# Patient Record
Sex: Female | Born: 1955 | Race: White | Hispanic: No | Smoking: Never smoker
Health system: Southern US, Community
[De-identification: ages and names within clinical notes are randomized; demographics above are authoritative.]

## PROBLEM LIST (undated history)

## (undated) DIAGNOSIS — G500 Trigeminal neuralgia: Secondary | ICD-10-CM

## (undated) DIAGNOSIS — T7840XA Allergy, unspecified, initial encounter: Secondary | ICD-10-CM

## (undated) DIAGNOSIS — H269 Unspecified cataract: Secondary | ICD-10-CM

## (undated) DIAGNOSIS — M810 Age-related osteoporosis without current pathological fracture: Secondary | ICD-10-CM

## (undated) DIAGNOSIS — J45909 Unspecified asthma, uncomplicated: Secondary | ICD-10-CM

## (undated) DIAGNOSIS — I639 Cerebral infarction, unspecified: Secondary | ICD-10-CM

## (undated) DIAGNOSIS — I10 Essential (primary) hypertension: Secondary | ICD-10-CM

## (undated) DIAGNOSIS — G35 Multiple sclerosis: Secondary | ICD-10-CM

## (undated) DIAGNOSIS — F419 Anxiety disorder, unspecified: Secondary | ICD-10-CM

## (undated) HISTORY — DX: Allergy, unspecified, initial encounter: T78.40XA

## (undated) HISTORY — DX: Unspecified cataract: H26.9

## (undated) HISTORY — DX: Cerebral infarction, unspecified: I63.9

## (undated) HISTORY — DX: Anxiety disorder, unspecified: F41.9

## (undated) HISTORY — PX: TUBAL LIGATION: SHX77

## (undated) HISTORY — DX: Age-related osteoporosis without current pathological fracture: M81.0

## (undated) HISTORY — PX: EYE SURGERY: SHX253

---

## 1999-05-11 ENCOUNTER — Other Ambulatory Visit: Admission: RE | Admit: 1999-05-11 | Discharge: 1999-05-11 | Payer: Self-pay | Admitting: Gynecology

## 2000-06-02 ENCOUNTER — Other Ambulatory Visit: Admission: RE | Admit: 2000-06-02 | Discharge: 2000-06-02 | Payer: Self-pay | Admitting: Gynecology

## 2001-08-07 ENCOUNTER — Other Ambulatory Visit: Admission: RE | Admit: 2001-08-07 | Discharge: 2001-08-07 | Payer: Self-pay | Admitting: Gynecology

## 2002-08-09 ENCOUNTER — Other Ambulatory Visit: Admission: RE | Admit: 2002-08-09 | Discharge: 2002-08-09 | Payer: Self-pay | Admitting: Gynecology

## 2003-08-19 ENCOUNTER — Other Ambulatory Visit: Admission: RE | Admit: 2003-08-19 | Discharge: 2003-08-19 | Payer: Self-pay | Admitting: Gynecology

## 2005-02-11 ENCOUNTER — Other Ambulatory Visit: Admission: RE | Admit: 2005-02-11 | Discharge: 2005-02-11 | Payer: Self-pay | Admitting: Gynecology

## 2013-02-16 DIAGNOSIS — G894 Chronic pain syndrome: Secondary | ICD-10-CM | POA: Insufficient documentation

## 2013-08-30 HISTORY — PX: INSERTION / PLACEMENT / REVISION NEUROSTIMULATOR: SUR720

## 2016-01-05 DIAGNOSIS — I10 Essential (primary) hypertension: Secondary | ICD-10-CM | POA: Insufficient documentation

## 2018-02-13 ENCOUNTER — Encounter (HOSPITAL_COMMUNITY): Payer: Self-pay

## 2018-02-13 ENCOUNTER — Encounter (HOSPITAL_COMMUNITY)
Admission: RE | Admit: 2018-02-13 | Discharge: 2018-02-13 | Disposition: A | Discharge status: Home / Self Care | Payer: BLUE CROSS/BLUE SHIELD | Source: Ambulatory Visit | Attending: Psychiatry | Admitting: Psychiatry

## 2018-02-13 DIAGNOSIS — G35 Multiple sclerosis: Secondary | ICD-10-CM | POA: Diagnosis present

## 2018-02-13 HISTORY — DX: Multiple sclerosis: G35

## 2018-02-13 HISTORY — DX: Unspecified asthma, uncomplicated: J45.909

## 2018-02-13 HISTORY — DX: Essential (primary) hypertension: I10

## 2018-02-13 HISTORY — DX: Trigeminal neuralgia: G50.0

## 2018-02-13 MED ORDER — ACETAMINOPHEN 500 MG PO TABS
1000.0000 mg | ORAL_TABLET | ORAL | Status: DC
  Administered 2018-02-13: 1000 mg via ORAL
  Filled 2018-02-13: qty 2

## 2018-02-13 MED ORDER — DIPHENHYDRAMINE HCL 50 MG/ML IJ SOLN
25.0000 mg | INTRAMUSCULAR | Status: DC
  Administered 2018-02-13: 25 mg via INTRAVENOUS
  Filled 2018-02-13: qty 1

## 2018-02-13 MED ORDER — SODIUM CHLORIDE 0.9 % IV SOLN
INTRAVENOUS | Status: DC
  Administered 2018-02-13: 08:00:00 via INTRAVENOUS

## 2018-02-13 MED ORDER — SODIUM CHLORIDE 0.9 % IV SOLN
300.0000 mg | INTRAVENOUS | Status: DC
  Administered 2018-02-13: 300 mg via INTRAVENOUS
  Filled 2018-02-13: qty 10

## 2018-02-13 MED ORDER — METHYLPREDNISOLONE SODIUM SUCC 125 MG IJ SOLR
125.0000 mg | INTRAMUSCULAR | Status: DC
  Administered 2018-02-13: 125 mg via INTRAVENOUS
  Filled 2018-02-13: qty 2

## 2018-02-13 NOTE — Progress Notes (Signed)
Patient here for first Ocrevus infusion. Tolerated well. All d/c instructions reviewed with patient. Verbalizes understanding of return appt in two weeks. Also aware of when to call MD. (Prior to infusion today results of negative Hep B screening faxed to [place] from MD office and results of negative quantiferon gold screening told over phone (awaiting fax copy).

## 2018-02-13 NOTE — Progress Notes (Signed)
Incomplete          Patient here for first Ocrevus infusion. Have copy of Hepatitis B result (non reactive) from office. Called and spoke to [place] at MD office and quantiferon gold (TB) result was negative  (awaiting fax result to come through).

## 2018-02-13 NOTE — Discharge Instructions (Signed)
Call 911 for emergencies Call Md for problems or questions. Dr. Leotis Shames [phone]   Reg[facility] Ocrelizumab injection What is this medicine? OCRELIZUMAB (ok re LIZ ue mab) treats multiple sclerosis. It helps to decrease the number of multiple sclerosis relapses. It is not a cure. This medicine may be used for other purposes; ask your health care provider or pharmacist if you have questions. COMMON BRAND NAME(S): OCREVUS What should I tell my health care provider before I take this medicine? They need to know if you have any of these conditions: -cancer -hepatitis B infection -other infection (especially a virus infection such as chickenpox, cold sores, or herpes) -an unusual or allergic reaction to ocrelizumab, other medicines, foods, dyes or preservatives -pregnant or trying to get pregnant -breast-feeding How should I use this medicine? This medicine is for infusion into a vein. It is given by a health care professional in a hospital or clinic setting. Talk to your pediatrician regarding the use of this medicine in children. Special care may be needed. Overdosage: If you think you have taken too much of this medicine contact a poison control center or emergency room at once. NOTE: This medicine is only for you. Do not share this medicine with others. What if I miss a dose? Keep appointments for follow-up doses as directed. It is important not to miss your dose. Call your doctor or health care professional if you are unable to keep an appointment. What may interact with this medicine? -alemtuzumab -daclizumab -dimethyl fumarate -fingolimod -glatiramer -interferon beta -live virus vaccines -mitoxantrone -natalizumab -peginterferon beta -rituximab -steroid medicines like prednisone or cortisone -teriflunomide This list may not describe all possible interactions. Give your health care provider a list of all the medicines, herbs, non-prescription drugs, or dietary supplements you  use. Also tell them if you smoke, drink alcohol, or use illegal drugs. Some items may interact with your medicine. What should I watch for while using this medicine? Tell your doctor or healthcare professional if your symptoms do not start to get better or if they get worse. This medicine can cause serious allergic reactions. To reduce your risk you may need to take medicine before treatment with this medicine. Take your medicine as directed. Women should inform their doctor if they wish to become pregnant or think they might be pregnant. There is a potential for serious side effects to an unborn child. Talk to your health care professional or pharmacist for more information. Female patients should use effective birth control methods while receiving this medicine and for 6 months after the last dose. Call your doctor or health care professional for advice if you get a fever, chills or sore throat, or other symptoms of a cold or flu. Do not treat yourself. This drug decreases your body's ability to fight infections. Try to avoid being around people who are sick. If you have a hepatitis B infection or a history of a hepatitis B infection, talk to your doctor. The symptoms of hepatitis B may get worse if you take this medicine. In some patients, this medicine may cause a serious brain infection that may cause death. If you have any problems seeing, thinking, speaking, walking, or standing, tell your doctor right away. If you cannot reach your doctor, urgently seek other source of medical care. This medicine can decrease the response to a vaccine. If you need to get vaccinated, tell your healthcare professional if you have received this medicine. Extra booster doses may be needed. Talk to your doctor to see  if a different vaccination schedule is needed. Talk to your doctor about your risk of cancer. You may be more at risk for certain types of cancers if you take this medicine. What side effects may I notice  from receiving this medicine? Side effects that you should report to your doctor or health care professional as soon as possible: -allergic reactions like skin rash, itching or hives, swelling of the face, lips, or tongue -breathing problems -facial flushing -fast, irregular heartbeat -lump or soreness in the breast -signs and symptoms of herpes such as cold sore, shingles, or genital sores -signs and symptoms of infection like fever or chills, cough, sore throat, pain or trouble passing urine -signs and symptoms of low blood pressure like dizziness; feeling faint or lightheaded, falls; unusually weak or tired -signs and symptoms of progressive multifocal leukoencephalopathy (PML) like changes in vision; clumsiness; confusion; personality changes; weakness on one side of the body -swelling of the ankles, feet, hands Side effects that usually do not require medical attention (report these to your doctor or health care professional if they continue or are bothersome): -back pain -depressed mood -diarrhea -pain, redness, or irritation at site where injected This list may not describe all possible side effects. Call your doctor for medical advice about side effects. You may report side effects to FDA at 1-800-FDA-1088. Where should I keep my medicine? This drug is given in a hospital or clinic and will not be stored at home. NOTE: This sheet is a summary. It may not cover all possible information. If you have questions about this medicine, talk to your doctor, pharmacist, or health care provider.  2018 [employer] Standard (2015-12-02 09:40:25)

## 2018-02-20 ENCOUNTER — Encounter (HOSPITAL_COMMUNITY): Payer: BLUE CROSS/BLUE SHIELD

## 2018-02-27 ENCOUNTER — Encounter (HOSPITAL_COMMUNITY): Payer: Self-pay

## 2018-02-27 ENCOUNTER — Encounter (HOSPITAL_COMMUNITY)
Admission: RE | Admit: 2018-02-27 | Discharge: 2018-02-27 | Disposition: A | Discharge status: Home / Self Care | Payer: BLUE CROSS/BLUE SHIELD | Source: Ambulatory Visit | Attending: Psychiatry | Admitting: Psychiatry

## 2018-02-27 DIAGNOSIS — G35 Multiple sclerosis: Secondary | ICD-10-CM | POA: Diagnosis present

## 2018-02-27 MED ORDER — SODIUM CHLORIDE 0.9 % IV SOLN
300.0000 mg | INTRAVENOUS | Status: AC
  Administered 2018-02-27: 300 mg via INTRAVENOUS
  Filled 2018-02-27: qty 10

## 2018-02-27 MED ORDER — DIPHENHYDRAMINE HCL 50 MG/ML IJ SOLN
25.0000 mg | INTRAMUSCULAR | Status: AC
  Administered 2018-02-27: 25 mg via INTRAVENOUS
  Filled 2018-02-27: qty 1

## 2018-02-27 MED ORDER — METHYLPREDNISOLONE SODIUM SUCC 125 MG IJ SOLR
125.0000 mg | INTRAMUSCULAR | Status: AC
  Administered 2018-02-27: 125 mg via INTRAVENOUS
  Filled 2018-02-27: qty 2

## 2018-02-27 MED ORDER — ACETAMINOPHEN 500 MG PO TABS
1000.0000 mg | ORAL_TABLET | ORAL | Status: AC
  Administered 2018-02-27: 1000 mg via ORAL
  Filled 2018-02-27: qty 2

## 2018-02-27 MED ORDER — SODIUM CHLORIDE 0.9 % IV SOLN
INTRAVENOUS | Status: AC
  Administered 2018-02-27: 08:00:00 via INTRAVENOUS

## 2018-02-27 NOTE — Discharge Instructions (Signed)
°Ocrevus °Ocrelizumab injection °What is this medicine? °OCRELIZUMAB (ok re LIZ ue mab) treats multiple sclerosis. It helps to decrease the number of multiple sclerosis relapses. It is not a cure. °This medicine may be used for other purposes; ask your health care provider or pharmacist if you have questions. °COMMON BRAND NAME(S): OCREVUS °What should I tell my health care provider before I take this medicine? °They need to know if you have any of these conditions: °-cancer °-hepatitis B infection °-other infection (especially a virus infection such as chickenpox, cold sores, or herpes) °-an unusual or allergic reaction to ocrelizumab, other medicines, foods, dyes or preservatives °-pregnant or trying to get pregnant °-breast-feeding °How should I use this medicine? °This medicine is for infusion into a vein. It is given by a health care professional in a hospital or clinic setting. °Talk to your pediatrician regarding the use of this medicine in children. Special care may be needed. °Overdosage: If you think you have taken too much of this medicine contact a poison control center or emergency room at once. °NOTE: This medicine is only for you. Do not share this medicine with others. °What if I miss a dose? °Keep appointments for follow-up doses as directed. It is important not to miss your dose. Call your doctor or health care professional if you are unable to keep an appointment. °What may interact with this medicine? °-alemtuzumab °-daclizumab °-dimethyl fumarate °-fingolimod °-glatiramer °-interferon beta °-live virus vaccines °-mitoxantrone °-natalizumab °-peginterferon beta °-rituximab °-steroid medicines like prednisone or cortisone °-teriflunomide °This list may not describe all possible interactions. Give your health care provider a list of all the medicines, herbs, non-prescription drugs, or dietary supplements you use. Also tell them if you smoke, drink alcohol, or use illegal drugs. Some items may  interact with your medicine. °What should I watch for while using this medicine? °Tell your doctor or healthcare professional if your symptoms do not start to get better or if they get worse. °This medicine can cause serious allergic reactions. To reduce your risk you may need to take medicine before treatment with this medicine. Take your medicine as directed. °Women should inform their doctor if they wish to become pregnant or think they might be pregnant. There is a potential for serious side effects to an unborn child. Talk to your health care professional or pharmacist for more information. Female patients should use effective birth control methods while receiving this medicine and for 6 months after the last dose. °Call your doctor or health care professional for advice if you get a fever, chills or sore throat, or other symptoms of a cold or flu. Do not treat yourself. This drug decreases your body's ability to fight infections. Try to avoid being around people who are sick. °If you have a hepatitis B infection or a history of a hepatitis B infection, talk to your doctor. The symptoms of hepatitis B may get worse if you take this medicine. °In some patients, this medicine may cause a serious brain infection that may cause death. If you have any problems seeing, thinking, speaking, walking, or standing, tell your doctor right away. If you cannot reach your doctor, urgently seek other source of medical care. °This medicine can decrease the response to a vaccine. If you need to get vaccinated, tell your healthcare professional if you have received this medicine. Extra booster doses may be needed. Talk to your doctor to see if a different vaccination schedule is needed. °Talk to your doctor about your risk of   cancer. You may be more at risk for certain types of cancers if you take this medicine. °What side effects may I notice from receiving this medicine? °Side effects that you should report to your doctor or  health care professional as soon as possible: °-allergic reactions like skin rash, itching or hives, swelling of the face, lips, or tongue °-breathing problems °-facial flushing °-fast, irregular heartbeat °-lump or soreness in the breast °-signs and symptoms of herpes such as cold sore, shingles, or genital sores °-signs and symptoms of infection like fever or chills, cough, sore throat, pain or trouble passing urine °-signs and symptoms of low blood pressure like dizziness; feeling faint or lightheaded, falls; unusually weak or tired °-signs and symptoms of progressive multifocal leukoencephalopathy (PML) like changes in vision; clumsiness; confusion; personality changes; weakness on one side of the body °-swelling of the ankles, feet, hands °Side effects that usually do not require medical attention (report these to your doctor or health care professional if they continue or are bothersome): °-back pain °-depressed mood °-diarrhea °-pain, redness, or irritation at site where injected °This list may not describe all possible side effects. Call your doctor for medical advice about side effects. You may report side effects to FDA at 1-800-FDA-1088. °Where should I keep my medicine? °This drug is given in a hospital or clinic and will not be stored at home. °NOTE: This sheet is a summary. It may not cover all possible information. If you have questions about this medicine, talk to your doctor, pharmacist, or health care provider. °© 2018 [employer]/Gold Standard (2015-12-02 09:40:25) ° °

## 2018-02-27 NOTE — Progress Notes (Signed)
Patient completed second 300mg  dose of Ocrevus and tolerated well. Has 6 month appointment. Aware of when to call MD.

## 2018-08-25 ENCOUNTER — Encounter (HOSPITAL_COMMUNITY): Payer: Self-pay

## 2018-08-25 ENCOUNTER — Encounter (HOSPITAL_COMMUNITY)
Admission: RE | Admit: 2018-08-25 | Discharge: 2018-08-25 | Disposition: A | Discharge status: Home / Self Care | Payer: BLUE CROSS/BLUE SHIELD | Source: Ambulatory Visit | Attending: Psychiatry | Admitting: Psychiatry

## 2018-08-25 DIAGNOSIS — G35 Multiple sclerosis: Secondary | ICD-10-CM | POA: Insufficient documentation

## 2018-08-25 MED ORDER — SODIUM CHLORIDE 0.9 % IV SOLN
Freq: Once | INTRAVENOUS | Status: AC
  Administered 2018-08-25: 07:00:00 via INTRAVENOUS

## 2018-08-25 MED ORDER — SODIUM CHLORIDE 0.9 % IV SOLN
600.0000 mg | Freq: Once | INTRAVENOUS | Status: AC
  Administered 2018-08-25: 600 mg via INTRAVENOUS
  Filled 2018-08-25: qty 20

## 2018-08-25 MED ORDER — DIPHENHYDRAMINE HCL 50 MG/ML IJ SOLN
25.0000 mg | Freq: Once | INTRAMUSCULAR | Status: AC
  Administered 2018-08-25: 25 mg via INTRAVENOUS
  Filled 2018-08-25: qty 1

## 2018-08-25 MED ORDER — ACETAMINOPHEN 500 MG PO TABS
1000.0000 mg | ORAL_TABLET | Freq: Once | ORAL | Status: AC
  Administered 2018-08-25: 1000 mg via ORAL
  Filled 2018-08-25: qty 2

## 2018-08-25 MED ORDER — METHYLPREDNISOLONE SODIUM SUCC 125 MG IJ SOLR
125.0000 mg | Freq: Once | INTRAMUSCULAR | Status: AC
  Administered 2018-08-25: 125 mg via INTRAVENOUS
  Filled 2018-08-25: qty 2

## 2018-08-25 NOTE — Discharge Instructions (Signed)
Ocrelizumab injection/Ocrevus Infusion ° °What is this medicine? °OCRELIZUMAB (ok re LIZ ue mab) treats multiple sclerosis. It helps to decrease the number of multiple sclerosis relapses. It is not a cure. °This medicine may be used for other purposes; ask your health care provider or pharmacist if you have questions. °COMMON BRAND NAME(S): OCREVUS °What should I tell my health care provider before I take this medicine? °They need to know if you have any of these conditions: °-cancer °-hepatitis B infection °-other infection (especially a virus infection such as chickenpox, cold sores, or herpes) °-an unusual or allergic reaction to ocrelizumab, other medicines, foods, dyes or preservatives °-pregnant or trying to get pregnant °-breast-feeding °How should I use this medicine? °This medicine is for infusion into a vein. It is given by a health care professional in a hospital or clinic setting. °Talk to your pediatrician regarding the use of this medicine in children. Special care may be needed. °Overdosage: If you think you have taken too much of this medicine contact a poison control center or emergency room at once. °NOTE: This medicine is only for you. Do not share this medicine with others. °What if I miss a dose? °Keep appointments for follow-up doses as directed. It is important not to miss your dose. Call your doctor or health care professional if you are unable to keep an appointment. °What may interact with this medicine? °-alemtuzumab °-daclizumab °-dimethyl fumarate °-fingolimod °-glatiramer °-interferon beta °-live virus vaccines °-mitoxantrone °-natalizumab °-peginterferon beta °-rituximab °-steroid medicines like prednisone or cortisone °-teriflunomide °This list may not describe all possible interactions. Give your health care provider a list of all the medicines, herbs, non-prescription drugs, or dietary supplements you use. Also tell them if you smoke, drink alcohol, or use illegal drugs. Some items  may interact with your medicine. °What should I watch for while using this medicine? °Tell your doctor or healthcare professional if your symptoms do not start to get better or if they get worse. °This medicine can cause serious allergic reactions. To reduce your risk you may need to take medicine before treatment with this medicine. Take your medicine as directed. °Women should inform their doctor if they wish to become pregnant or think they might be pregnant. There is a potential for serious side effects to an unborn child. Talk to your health care professional or pharmacist for more information. Female patients should use effective birth control methods while receiving this medicine and for 6 months after the last dose. °Call your doctor or health care professional for advice if you get a fever, chills or sore throat, or other symptoms of a cold or flu. Do not treat yourself. This drug decreases your body's ability to fight infections. Try to avoid being around people who are sick. °If you have a hepatitis B infection or a history of a hepatitis B infection, talk to your doctor. The symptoms of hepatitis B may get worse if you take this medicine. °In some patients, this medicine may cause a serious brain infection that may cause death. If you have any problems seeing, thinking, speaking, walking, or standing, tell your doctor right away. If you cannot reach your doctor, urgently seek other source of medical care. °This medicine can decrease the response to a vaccine. If you need to get vaccinated, tell your healthcare professional if you have received this medicine. Extra booster doses may be needed. Talk to your doctor to see if a different vaccination schedule is needed. °Talk to your doctor about your risk   of cancer. You may be more at risk for certain types of cancers if you take this medicine. What side effects may I notice from receiving this medicine? Side effects that you should report to your doctor  or health care professional as soon as possible: -allergic reactions like skin rash, itching or hives, swelling of the face, lips, or tongue -breathing problems -facial flushing -fast, irregular heartbeat -lump or soreness in the breast -signs and symptoms of herpes such as cold sore, shingles, or genital sores -signs and symptoms of infection like fever or chills, cough, sore throat, pain or trouble passing urine -signs and symptoms of low blood pressure like dizziness; feeling faint or lightheaded, falls; unusually weak or tired -signs and symptoms of progressive multifocal leukoencephalopathy (PML) like changes in vision; clumsiness; confusion; personality changes; weakness on one side of the body -swelling of the ankles, feet, hands Side effects that usually do not require medical attention (report these to your doctor or health care professional if they continue or are bothersome): -back pain -depressed mood -diarrhea -pain, redness, or irritation at site where injected This list may not describe all possible side effects. Call your doctor for medical advice about side effects. You may report side effects to FDA at 1-800-FDA-1088. Where should I keep my medicine? This drug is given in a hospital or clinic and will not be stored at home. NOTE: This sheet is a summary. It may not cover all possible information. If you have questions about this medicine, talk to your doctor, pharmacist, or health care provider.  2019 [employer]/Gold Standard (2015-12-02 09:40:25)

## 2018-09-25 ENCOUNTER — Encounter (HOSPITAL_COMMUNITY): Payer: BLUE CROSS/BLUE SHIELD

## 2019-02-27 ENCOUNTER — Ambulatory Visit (HOSPITAL_COMMUNITY): Payer: BLUE CROSS/BLUE SHIELD

## 2019-03-07 ENCOUNTER — Encounter (INDEPENDENT_AMBULATORY_CARE_PROVIDER_SITE_OTHER): Payer: Self-pay

## 2019-03-07 ENCOUNTER — Other Ambulatory Visit: Payer: PRIVATE HEALTH INSURANCE

## 2019-03-07 ENCOUNTER — Other Ambulatory Visit: Payer: Self-pay

## 2019-03-07 ENCOUNTER — Ambulatory Visit (HOSPITAL_COMMUNITY)
Admission: RE | Admit: 2019-03-07 | Discharge: 2019-03-07 | Disposition: A | Discharge status: Home / Self Care | Payer: Medicare Other | Source: Ambulatory Visit | Attending: Psychiatry | Admitting: Psychiatry

## 2019-03-07 DIAGNOSIS — G35 Multiple sclerosis: Secondary | ICD-10-CM | POA: Diagnosis not present

## 2019-03-07 MED ORDER — METHYLPREDNISOLONE SODIUM SUCC 125 MG IJ SOLR
INTRAMUSCULAR | Status: AC
  Administered 2019-03-07: 125 mg via INTRAVENOUS
  Filled 2019-03-07: qty 2

## 2019-03-07 MED ORDER — DIPHENHYDRAMINE HCL 50 MG/ML IJ SOLN
INTRAMUSCULAR | Status: AC
  Administered 2019-03-07: 25 mg via INTRAVENOUS
  Filled 2019-03-07: qty 1

## 2019-03-07 MED ORDER — ACETAMINOPHEN 500 MG PO TABS
ORAL_TABLET | ORAL | Status: AC
  Administered 2019-03-07: 1000 mg via ORAL
  Filled 2019-03-07: qty 2

## 2019-03-07 MED ORDER — ACETAMINOPHEN 500 MG PO TABS
1000.0000 mg | ORAL_TABLET | ORAL | Status: DC
  Administered 2019-03-07: 1000 mg via ORAL

## 2019-03-07 MED ORDER — DIPHENHYDRAMINE HCL 50 MG/ML IJ SOLN
25.0000 mg | INTRAMUSCULAR | Status: DC
  Administered 2019-03-07: 25 mg via INTRAVENOUS

## 2019-03-07 MED ORDER — SODIUM CHLORIDE 0.9 % IV SOLN
600.0000 mg | INTRAVENOUS | Status: DC
  Administered 2019-03-07: 600 mg via INTRAVENOUS
  Filled 2019-03-07: qty 20

## 2019-03-07 MED ORDER — SODIUM CHLORIDE 0.9 % IV SOLN
INTRAVENOUS | Status: DC
  Administered 2019-03-07: 08:00:00 via INTRAVENOUS

## 2019-03-07 MED ORDER — METHYLPREDNISOLONE SODIUM SUCC 125 MG IJ SOLR
125.0000 mg | INTRAMUSCULAR | Status: DC
  Administered 2019-03-07: 125 mg via INTRAVENOUS

## 2019-09-07 ENCOUNTER — Ambulatory Visit (HOSPITAL_COMMUNITY)
Admission: RE | Admit: 2019-09-07 | Discharge: 2019-09-07 | Disposition: A | Discharge status: Home / Self Care | Payer: Medicare Other | Source: Ambulatory Visit | Attending: Internal Medicine | Admitting: Internal Medicine

## 2019-09-07 DIAGNOSIS — G35 Multiple sclerosis: Secondary | ICD-10-CM | POA: Diagnosis not present

## 2019-09-07 MED ORDER — ACETAMINOPHEN 500 MG PO TABS
1000.0000 mg | ORAL_TABLET | Freq: Once | ORAL | Status: AC
  Administered 2019-09-07: 09:00:00 1000 mg via ORAL
  Filled 2019-09-07: qty 2

## 2019-09-07 MED ORDER — DIPHENHYDRAMINE HCL 50 MG/ML IJ SOLN
25.0000 mg | Freq: Once | INTRAMUSCULAR | Status: AC
  Administered 2019-09-07: 10:00:00 25 mg via INTRAVENOUS
  Filled 2019-09-07: qty 1

## 2019-09-07 MED ORDER — METHYLPREDNISOLONE SODIUM SUCC 125 MG IJ SOLR
100.0000 mg | Freq: Once | INTRAMUSCULAR | Status: AC
  Administered 2019-09-07: 100 mg via INTRAVENOUS
  Filled 2019-09-07: qty 2

## 2019-09-07 MED ORDER — OCRELIZUMAB 300 MG/10ML IV SOLN
600.0000 mg | Freq: Once | INTRAVENOUS | Status: AC
  Administered 2019-09-07: 10:00:00 600 mg via INTRAVENOUS
  Filled 2019-09-07: qty 20

## 2019-09-07 MED ORDER — SODIUM CHLORIDE 0.9 % IV SOLN
INTRAVENOUS | Status: DC | PRN
  Administered 2019-09-07: 250 mL via INTRAVENOUS

## 2019-09-07 NOTE — Discharge Instructions (Signed)
Ocrelizumab injection °What is this medicine? °OCRELIZUMAB (ok re LIZ ue mab) treats multiple sclerosis. It helps to decrease the number of multiple sclerosis relapses. It is not a cure. °This medicine may be used for other purposes; ask your health care provider or pharmacist if you have questions. °COMMON BRAND NAME(S): OCREVUS °What should I tell my health care provider before I take this medicine? °They need to know if you have any of these conditions: °· cancer °· hepatitis B infection °· other infection (especially a virus infection such as chickenpox, cold sores, or herpes) °· an unusual or allergic reaction to ocrelizumab, other medicines, foods, dyes or preservatives °· pregnant or trying to get pregnant °· breast-feeding °How should I use this medicine? °This medicine is for infusion into a vein. It is given by a health care professional in a hospital or clinic setting. °A special MedGuide will be given to you before each treatment. Be sure to read this information carefully each time. °Talk to your pediatrician regarding the use of this medicine in children. Special care may be needed. °Overdosage: If you think you have taken too much of this medicine contact a poison control center or emergency room at once. °NOTE: This medicine is only for you. Do not share this medicine with others. °What if I miss a dose? °Keep appointments for follow-up doses as directed. It is important not to miss your dose. Call your doctor or health care professional if you are unable to keep an appointment. °What may interact with this medicine? °· alemtuzumab °· daclizumab °· dimethyl fumarate °· fingolimod °· glatiramer °· interferon beta °· live virus vaccines °· mitoxantrone °· natalizumab °· peginterferon beta °· rituximab °· steroid medicines like prednisone or cortisone °· teriflunomide °This list may not describe all possible interactions. Give your health care provider a list of all the medicines, herbs,  non-prescription drugs, or dietary supplements you use. Also tell them if you smoke, drink alcohol, or use illegal drugs. Some items may interact with your medicine. °What should I watch for while using this medicine? °Tell your doctor or healthcare professional if your symptoms do not start to get better or if they get worse. °This medicine can cause serious allergic reactions. To reduce your risk you may need to take medicine before treatment with this medicine. Take your medicine as directed. °Women should inform their doctor if they wish to become pregnant or think they might be pregnant. There is a potential for serious side effects to an unborn child. Talk to your health care professional or pharmacist for more information. Female patients should use effective birth control methods while receiving this medicine and for 6 months after the last dose. °Call your doctor or health care professional for advice if you get a fever, chills or sore throat, or other symptoms of a cold or flu. Do not treat yourself. This drug decreases your body's ability to fight infections. Try to avoid being around people who are sick. °If you have a hepatitis B infection or a history of a hepatitis B infection, talk to your doctor. The symptoms of hepatitis B may get worse if you take this medicine. °In some patients, this medicine may cause a serious brain infection that may cause death. If you have any problems seeing, thinking, speaking, walking, or standing, tell your doctor right away. If you cannot reach your doctor, urgently seek other source of medical care. °This medicine can decrease the response to a vaccine. If you need to get   vaccinated, tell your healthcare professional if you have received this medicine. Extra booster doses may be needed. Talk to your doctor to see if a different vaccination schedule is needed. °Talk to your doctor about your risk of cancer. You may be more at risk for certain types of cancers if you  take this medicine. °What side effects may I notice from receiving this medicine? °Side effects that you should report to your doctor or health care professional as soon as possible: °· allergic reactions like skin rash, itching or hives, swelling of the face, lips, or tongue °· breathing problems °· facial flushing °· fast, irregular heartbeat °· lump or soreness in the breast °· signs and symptoms of herpes such as cold sore, shingles, or genital sores °· signs and symptoms of infection like fever or chills, cough, sore throat, pain or trouble passing urine °· signs and symptoms of low blood pressure like dizziness; feeling faint or lightheaded, falls; unusually weak or tired °· signs and symptoms of progressive multifocal leukoencephalopathy (PML) like changes in vision; clumsiness; confusion; personality changes; weakness on one side of the body °· swelling of the ankles, feet, hands °Side effects that usually do not require medical attention (report these to your doctor or health care professional if they continue or are bothersome): °· back pain °· depressed mood °· diarrhea °· pain, redness, or irritation at site where injected °This list may not describe all possible side effects. Call your doctor for medical advice about side effects. You may report side effects to FDA at 1-800-FDA-1088. °Where should I keep my medicine? °This drug is given in a hospital or clinic and will not be stored at home. °NOTE: This sheet is a summary. It may not cover all possible information. If you have questions about this medicine, talk to your doctor, pharmacist, or health care provider. °© 2020 [employer] Standard (2018-08-21 07:41:53) ° °

## 2019-09-07 NOTE — Progress Notes (Signed)
PATIENT CARE CENTER NOTE  Diagnosis: G35 Multiple Sclerosis    Provider: Trudie Buckler, MD   Procedure: Emogene Morgan infusion    Note: Patient received 600 mg Ocrevus infusion via PIV. Pre-medications given per ordrer (Solu-Medrol, Benadryl and Tylenol). Tolerated well with no adverse reaction. Patient observed for 30 minutes post-infusion. Patient did not want to wait the whole 1 hour. Vital sings stable. Discharge instructions given. Patient to come back in 6 months for next dose. Alert, oriented and ambulatory at discharge.

## 2019-10-15 ENCOUNTER — Ambulatory Visit: Payer: PRIVATE HEALTH INSURANCE

## 2020-02-29 ENCOUNTER — Telehealth: Payer: Self-pay | Admitting: *Deleted

## 2020-02-29 ENCOUNTER — Ambulatory Visit (INDEPENDENT_AMBULATORY_CARE_PROVIDER_SITE_OTHER): Payer: Medicare Other | Admitting: Podiatry

## 2020-02-29 ENCOUNTER — Other Ambulatory Visit: Payer: Self-pay

## 2020-02-29 ENCOUNTER — Encounter: Payer: Self-pay | Admitting: Podiatry

## 2020-02-29 ENCOUNTER — Ambulatory Visit (INDEPENDENT_AMBULATORY_CARE_PROVIDER_SITE_OTHER): Payer: Medicare Other

## 2020-02-29 DIAGNOSIS — M722 Plantar fascial fibromatosis: Secondary | ICD-10-CM | POA: Diagnosis not present

## 2020-02-29 DIAGNOSIS — M79671 Pain in right foot: Secondary | ICD-10-CM

## 2020-02-29 DIAGNOSIS — M7661 Achilles tendinitis, right leg: Secondary | ICD-10-CM

## 2020-02-29 MED ORDER — MELOXICAM 15 MG PO TABS: 15.0000 mg | ORAL_TABLET | Freq: Every day | ORAL | 0 refills | Status: DC

## 2020-02-29 NOTE — Telephone Encounter (Signed)
Pt states she saw Dr. Lilian Kapur today and was to have a medication called to her pharmacy.

## 2020-02-29 NOTE — Progress Notes (Signed)
  Subjective:  Patient ID: Regina Neal, female    DOB: 1956-06-06,  MRN: [id number]  Chief Complaint  Patient presents with  . Plantar Fasciitis    right heel     64 y.o. female presents with the above complaint. History confirmed with patient.  Worsening over the last month and a half, she describes it as sore and tender.  Hurts with walking and with rest of the morning.  Denies numbness and tingling.  Ibuprofen is not helping.  Objective:  Physical Exam: warm, good capillary refill, no trophic changes or ulcerative lesions, normal DP and PT pulses and normal sensory exam. Left Foot: normal exam, no swelling, tenderness, instability; ligaments intact, full range of motion of all ankle/foot joints  Right Foot: point tenderness over the heel pad and tenderness at Achilles tendon insertion   No images are attached to the encounter.  Radiographs: X-ray of the right foot: plantar calcaneal spur and posterior calcaneal spur Assessment:   1. Pain of right heel   2. Plantar fasciitis of right foot   3. Achilles tendinitis of right lower extremity      Plan:  Patient was evaluated and treated and all questions answered.  Achilles Tendonitis and Plantar Fasciitis -XR reviewed with patient -Educated on stretching and icing of the affected limb. -Night splint dispensed. -Rx for Meloxicam. Advised on risks, benefits, and alternatives of the medication -Injection delivered to the plantar fascia of the right foot following sterile prep consisting of 1 cc lidocaine 2%, 1 cc Marcaine 0.5%, 0.5 cc Kenalog 10.  Procedure tolerated well  Return in about 4 weeks (around 03/28/2020).

## 2020-02-29 NOTE — Patient Instructions (Signed)
Achilles Tendinitis  with Rehab Achilles tendinitis is a disorder of the Achilles tendon. The Achilles tendon connects the large calf muscles (Gastrocnemius and Soleus) to the heel bone (calcaneus). This tendon is sometimes called the heel cord. It is important for pushing-off and standing on your toes and is important for walking, running, or jumping. Tendinitis is often caused by overuse and repetitive microtrauma. SYMPTOMS  Pain, tenderness, swelling, warmth, and redness may occur over the Achilles tendon even at rest.  Pain with pushing off, or flexing or extending the ankle.  Pain that is worsened after or during activity. CAUSES   Overuse sometimes seen with rapid increase in exercise programs or in sports requiring running and jumping.  Poor physical conditioning (strength and flexibility or endurance).  Running sports, especially training running down hills.  Inadequate warm-up before practice or play or failure to stretch before participation.  Injury to the tendon. PREVENTION   Warm up and stretch before practice or competition.  Allow time for adequate rest and recovery between practices and competition.  Keep up conditioning.  Keep up ankle and leg flexibility.  Improve or keep muscle strength and endurance.  Improve cardiovascular fitness.  Use proper technique.  Use proper equipment (shoes, skates).  To help prevent recurrence, taping, protective strapping, or an adhesive bandage may be recommended for several weeks after healing is complete. PROGNOSIS   Recovery may take weeks to several months to heal.  Longer recovery is expected if symptoms have been prolonged.  Recovery is usually quicker if the inflammation is due to a direct blow as compared with overuse or sudden strain. RELATED COMPLICATIONS   Healing time will be prolonged if the condition is not correctly treated. The injury must be given plenty of time to heal.  Symptoms can reoccur if  activity is resumed too soon.  Untreated, tendinitis may increase the risk of tendon rupture requiring additional time for recovery and possibly surgery. TREATMENT   The first treatment consists of rest anti-inflammatory medication, and ice to relieve the pain.  Stretching and strengthening exercises after resolution of pain will likely help reduce the risk of recurrence. Referral to a physical therapist or athletic trainer for further evaluation and treatment may be helpful.  A walking boot or cast may be recommended to rest the Achilles tendon. This can help break the cycle of inflammation and microtrauma.  Arch supports (orthotics) may be prescribed or recommended by your caregiver as an adjunct to therapy and rest.  Surgery to remove the inflamed tendon lining or degenerated tendon tissue is rarely necessary and has shown less than predictable results. MEDICATION   Nonsteroidal anti-inflammatory medications, such as aspirin and ibuprofen, may be used for pain and inflammation relief. Do not take within 7 days before surgery. Take these as directed by your caregiver. Contact your caregiver immediately if any bleeding, stomach upset, or signs of allergic reaction occur. Other minor pain relievers, such as acetaminophen, may also be used.  Pain relievers may be prescribed as necessary by your caregiver. Do not take prescription pain medication for longer than 4 to 7 days. Use only as directed and only as much as you need.  Cortisone injections are rarely indicated. Cortisone injections may weaken tendons and predispose to rupture. It is better to give the condition more time to heal than to use them. HEAT AND COLD  Cold is used to relieve pain and reduce inflammation for acute and chronic Achilles tendinitis. Cold should be applied for 10 to 15 minutes   every 2 to 3 hours for inflammation and pain and immediately after any activity that aggravates your symptoms. Use ice packs or an ice  massage.  Heat may be used before performing stretching and strengthening activities prescribed by your caregiver. Use a heat pack or a warm soak. SEEK MEDICAL CARE IF:  Symptoms get worse or do not improve in 2 weeks despite treatment.  New, unexplained symptoms develop. Drugs used in treatment may produce side effects.  EXERCISES:  RANGE OF MOTION (ROM) AND STRETCHING EXERCISES - Achilles Tendinitis  These exercises may help you when beginning to rehabilitate your injury. Your symptoms may resolve with or without further involvement from your physician, physical therapist or athletic trainer. While completing these exercises, remember:   Restoring tissue flexibility helps normal motion to return to the joints. This allows healthier, less painful movement and activity.  An effective stretch should be held for at least 30 seconds.  A stretch should never be painful. You should only feel a gentle lengthening or release in the stretched tissue.  STRETCH  Gastroc, Standing   Place hands on wall.  Extend right / left leg, keeping the front knee somewhat bent.  Slightly point your toes inward on your back foot.  Keeping your right / left heel on the floor and your knee straight, shift your weight toward the wall, not allowing your back to arch.  You should feel a gentle stretch in the right / left calf. Hold this position for 10 seconds. Repeat 3 times. Complete this stretch 2 times per day.  STRETCH  Soleus, Standing   Place hands on wall.  Extend right / left leg, keeping the other knee somewhat bent.  Slightly point your toes inward on your back foot.  Keep your right / left heel on the floor, bend your back knee, and slightly shift your weight over the back leg so that you feel a gentle stretch deep in your back calf.  Hold this position for 10 seconds. Repeat 3 times. Complete this stretch 2 times per day.  STRETCH  Gastrocsoleus, Standing  Note: This exercise can place  a lot of stress on your foot and ankle. Please complete this exercise only if specifically instructed by your caregiver.   Place the ball of your right / left foot on a step, keeping your other foot firmly on the same step.  Hold on to the wall or a rail for balance.  Slowly lift your other foot, allowing your body weight to press your heel down over the edge of the step.  You should feel a stretch in your right / left calf.  Hold this position for 10 seconds.  Repeat this exercise with a slight bend in your knee. Repeat 3 times. Complete this stretch 2 times per day.   STRENGTHENING EXERCISES - Achilles Tendinitis These exercises may help you when beginning to rehabilitate your injury. They may resolve your symptoms with or without further involvement from your physician, physical therapist or athletic trainer. While completing these exercises, remember:   Muscles can gain both the endurance and the strength needed for everyday activities through controlled exercises.  Complete these exercises as instructed by your physician, physical therapist or athletic trainer. Progress the resistance and repetitions only as guided.  You may experience muscle soreness or fatigue, but the pain or discomfort you are trying to eliminate should never worsen during these exercises. If this pain does worsen, stop and make certain you are following the directions exactly. If   the pain is still present after adjustments, discontinue the exercise until you can discuss the trouble with your clinician.  STRENGTH - Plantar-flexors   Sit with your right / left leg extended. Holding onto both ends of a rubber exercise band/tubing, loop it around the ball of your foot. Keep a slight tension in the band.  Slowly push your toes away from you, pointing them downward.  Hold this position for 10 seconds. Return slowly, controlling the tension in the band/tubing. Repeat 3 times. Complete this exercise 2 times per day.     STRENGTH - Plantar-flexors   Stand with your feet shoulder width apart. Steady yourself with a wall or table using as little support as needed.  Keeping your weight evenly spread over the width of your feet, rise up on your toes.*  Hold this position for 10 seconds. Repeat 3 times. Complete this exercise 2 times per day.  *If this is too easy, shift your weight toward your right / left leg until you feel challenged. Ultimately, you may be asked to do this exercise with your right / left foot only.  STRENGTH  Plantar-flexors, Eccentric  Note: This exercise can place a lot of stress on your foot and ankle. Please complete this exercise only if specifically instructed by your caregiver.   Place the balls of your feet on a step. With your hands, use only enough support from a wall or rail to keep your balance.  Keep your knees straight and rise up on your toes.  Slowly shift your weight entirely to your right / left toes and pick up your opposite foot. Gently and with controlled movement, lower your weight through your right / left foot so that your heel drops below the level of the step. You will feel a slight stretch in the back of your calf at the end position.  Use the healthy leg to help rise up onto the balls of both feet, then lower weight only on the right / left leg again. Build up to 15 repetitions. Then progress to 3 consecutive sets of 15 repetitions.*  After completing the above exercise, complete the same exercise with a slight knee bend (about 30 degrees). Again, build up to 15 repetitions. Then progress to 3 consecutive sets of 15 repetitions.* Perform this exercise 2 times per day.  *When you easily complete 3 sets of 15, your physician, physical therapist or athletic trainer may advise you to add resistance by wearing a backpack filled with additional weight.  STRENGTH - Plantar Flexors, Seated   Sit on a chair that allows your feet to rest flat on the ground. If  necessary, sit at the edge of the chair.  Keeping your toes firmly on the ground, lift your right / left heel as far as you can without increasing any discomfort in your ankle. Repeat 3 times. Complete this exercise 2 times a day.    Plantar Fasciitis (Heel Spur Syndrome) with Rehab The plantar fascia is a fibrous, ligament-like, soft-tissue structure that spans the bottom of the foot. Plantar fasciitis is a condition that causes pain in the foot due to inflammation of the tissue. SYMPTOMS   Pain and tenderness on the underneath side of the foot.  Pain that worsens with standing or walking. CAUSES  Plantar fasciitis is caused by irritation and injury to the plantar fascia on the underneath side of the foot. Common mechanisms of injury include:  Direct trauma to bottom of the foot.  Damage to a small   nerve that runs under the foot where the main fascia attaches to the heel bone.  Stress placed on the plantar fascia due to bone spurs. RISK INCREASES WITH:   Activities that place stress on the plantar fascia (running, jumping, pivoting, or cutting).  Poor strength and flexibility.  Improperly fitted shoes.  Tight calf muscles.  Flat feet.  Failure to warm-up properly before activity.  Obesity. PREVENTION  Warm up and stretch properly before activity.  Allow for adequate recovery between workouts.  Maintain physical fitness:  Strength, flexibility, and endurance.  Cardiovascular fitness.  Maintain a health body weight.  Avoid stress on the plantar fascia.  Wear properly fitted shoes, including arch supports for individuals who have flat feet.  PROGNOSIS  If treated properly, then the symptoms of plantar fasciitis usually resolve without surgery. However, occasionally surgery is necessary.  RELATED COMPLICATIONS   Recurrent symptoms that may result in a chronic condition.  Problems of the lower back that are caused by compensating for the injury, such as  limping.  Pain or weakness of the foot during push-off following surgery.  Chronic inflammation, scarring, and partial or complete fascia tear, occurring more often from repeated injections.  TREATMENT  Treatment initially involves the use of ice and medication to help reduce pain and inflammation. The use of strengthening and stretching exercises may help reduce pain with activity, especially stretches of the Achilles tendon. These exercises may be performed at home or with a therapist. Your caregiver may recommend that you use heel cups of arch supports to help reduce stress on the plantar fascia. Occasionally, corticosteroid injections are given to reduce inflammation. If symptoms persist for greater than 6 months despite non-surgical (conservative), then surgery may be recommended.   MEDICATION   If pain medication is necessary, then nonsteroidal anti-inflammatory medications, such as aspirin and ibuprofen, or other minor pain relievers, such as acetaminophen, are often recommended.  Do not take pain medication within 7 days before surgery.  Prescription pain relievers may be given if deemed necessary by your caregiver. Use only as directed and only as much as you need.  Corticosteroid injections may be given by your caregiver. These injections should be reserved for the most serious cases, because they may only be given a certain number of times.  HEAT AND COLD  Cold treatment (icing) relieves pain and reduces inflammation. Cold treatment should be applied for 10 to 15 minutes every 2 to 3 hours for inflammation and pain and immediately after any activity that aggravates your symptoms. Use ice packs or massage the area with a piece of ice (ice massage).  Heat treatment may be used prior to performing the stretching and strengthening activities prescribed by your caregiver, physical therapist, or athletic trainer. Use a heat pack or soak the injury in warm water.  SEEK IMMEDIATE MEDICAL  CARE IF:  Treatment seems to offer no benefit, or the condition worsens.  Any medications produce adverse side effects.  EXERCISES- RANGE OF MOTION (ROM) AND STRETCHING EXERCISES - Plantar Fasciitis (Heel Spur Syndrome) These exercises may help you when beginning to rehabilitate your injury. Your symptoms may resolve with or without further involvement from your physician, physical therapist or athletic trainer. While completing these exercises, remember:   Restoring tissue flexibility helps normal motion to return to the joints. This allows healthier, less painful movement and activity.  An effective stretch should be held for at least 30 seconds.  A stretch should never be painful. You should only feel a gentle lengthening   or release in the stretched tissue.  RANGE OF MOTION - Toe Extension, Flexion  Sit with your right / left leg crossed over your opposite knee.  Grasp your toes and gently pull them back toward the top of your foot. You should feel a stretch on the bottom of your toes and/or foot.  Hold this stretch for 10 seconds.  Now, gently pull your toes toward the bottom of your foot. You should feel a stretch on the top of your toes and or foot.  Hold this stretch for 10 seconds. Repeat  times. Complete this stretch 3 times per day.   RANGE OF MOTION - Ankle Dorsiflexion, Active Assisted  Remove shoes and sit on a chair that is preferably not on a carpeted surface.  Place right / left foot under knee. Extend your opposite leg for support.  Keeping your heel down, slide your right / left foot back toward the chair until you feel a stretch at your ankle or calf. If you do not feel a stretch, slide your bottom forward to the edge of the chair, while still keeping your heel down.  Hold this stretch for 10 seconds. Repeat 3 times. Complete this stretch 2 times per day.   STRETCH  Gastroc, Standing  Place hands on wall.  Extend right / left leg, keeping the front knee  somewhat bent.  Slightly point your toes inward on your back foot.  Keeping your right / left heel on the floor and your knee straight, shift your weight toward the wall, not allowing your back to arch.  You should feel a gentle stretch in the right / left calf. Hold this position for 10 seconds. Repeat 3 times. Complete this stretch 2 times per day.  STRETCH  Soleus, Standing  Place hands on wall.  Extend right / left leg, keeping the other knee somewhat bent.  Slightly point your toes inward on your back foot.  Keep your right / left heel on the floor, bend your back knee, and slightly shift your weight over the back leg so that you feel a gentle stretch deep in your back calf.  Hold this position for 10 seconds. Repeat 3 times. Complete this stretch 2 times per day.  STRETCH  Gastrocsoleus, Standing  Note: This exercise can place a lot of stress on your foot and ankle. Please complete this exercise only if specifically instructed by your caregiver.   Place the ball of your right / left foot on a step, keeping your other foot firmly on the same step.  Hold on to the wall or a rail for balance.  Slowly lift your other foot, allowing your body weight to press your heel down over the edge of the step.  You should feel a stretch in your right / left calf.  Hold this position for 10 seconds.  Repeat this exercise with a slight bend in your right / left knee. Repeat 3 times. Complete this stretch 2 times per day.   STRENGTHENING EXERCISES - Plantar Fasciitis (Heel Spur Syndrome)  These exercises may help you when beginning to rehabilitate your injury. They may resolve your symptoms with or without further involvement from your physician, physical therapist or athletic trainer. While completing these exercises, remember:   Muscles can gain both the endurance and the strength needed for everyday activities through controlled exercises.  Complete these exercises as instructed by  your physician, physical therapist or athletic trainer. Progress the resistance and repetitions only as guided.  STRENGTH -   Towel Curls  Sit in a chair positioned on a non-carpeted surface.  Place your foot on a towel, keeping your heel on the floor.  Pull the towel toward your heel by only curling your toes. Keep your heel on the floor. Repeat 3 times. Complete this exercise 2 times per day.  STRENGTH - Ankle Inversion  Secure one end of a rubber exercise band/tubing to a fixed object (table, pole). Loop the other end around your foot just before your toes.  Place your fists between your knees. This will focus your strengthening at your ankle.  Slowly, pull your big toe up and in, making sure the band/tubing is positioned to resist the entire motion.  Hold this position for 10 seconds.  Have your muscles resist the band/tubing as it slowly pulls your foot back to the starting position. Repeat 3 times. Complete this exercises 2 times per day.  Document Released: 08/16/2005 Document Revised: 11/08/2011 Document Reviewed: 11/28/2008 [employer] Patient Information 2014 [employer].  

## 2020-03-06 ENCOUNTER — Ambulatory Visit (HOSPITAL_COMMUNITY): Payer: PRIVATE HEALTH INSURANCE

## 2020-04-04 ENCOUNTER — Ambulatory Visit: Payer: Medicare Other | Admitting: Podiatry

## 2020-04-18 ENCOUNTER — Ambulatory Visit: Payer: Medicare Other | Admitting: Podiatry

## 2020-09-04 ENCOUNTER — Ambulatory Visit (INDEPENDENT_AMBULATORY_CARE_PROVIDER_SITE_OTHER): Payer: Medicare Other

## 2020-09-04 ENCOUNTER — Ambulatory Visit (INDEPENDENT_AMBULATORY_CARE_PROVIDER_SITE_OTHER): Payer: Medicare Other | Admitting: Podiatry

## 2020-09-04 ENCOUNTER — Other Ambulatory Visit: Payer: Self-pay

## 2020-09-04 ENCOUNTER — Encounter: Payer: Self-pay | Admitting: Podiatry

## 2020-09-04 DIAGNOSIS — M84374A Stress fracture, right foot, initial encounter for fracture: Secondary | ICD-10-CM

## 2020-09-04 DIAGNOSIS — Q66219 Congenital metatarsus primus varus, unspecified foot: Secondary | ICD-10-CM | POA: Diagnosis not present

## 2020-09-04 MED ORDER — MELOXICAM 15 MG PO TABS: ORAL_TABLET | ORAL | 2 refills | Status: DC

## 2020-09-04 NOTE — Progress Notes (Signed)
  Subjective:  Patient ID: Regina Neal, female    DOB: March 29, 1956,  MRN: [id number]  Chief Complaint  Patient presents with  . Foot Pain    Right foot. PT stated that she has had random swelling to the top of her foot.    65 y.o. female presents with the above complaint. History confirmed with patient.  This is a new issue for her.  The heel pain has completely resolved after injection and stretching.  The pain began over Christmas as she was getting something out of the cabinet and standing up in the tips of her toes.  The pain is over the distal midfoot  Objective:  Physical Exam: warm, good capillary refill, no trophic changes or ulcerative lesions, normal DP and PT pulses and normal sensory exam. Right Foot: She has sharp pain on the second metatarsal base with palpation hallux valgus present no heel pain  Radiographs: X-ray of the left foot: Small area of transverse lucency in the base of the proximal phalanx or also cortical striations within the bone itself.  She has moderate to severe hallux valgus. Assessment:   1. Stress fracture of metatarsal bone of right foot, initial encounter      Plan:  Patient was evaluated and treated and all questions answered.   I believe that the small cortical striation is an early stress fracture that we are seeing.  She has sharp pain in the area of concern that is consistent with the finding on the x-ray.  I recommend immobilization in a short CAM boot.  I also recommend she can even up brace to level out her hips and back.  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  Meloxicam is prescription sent to her pharmacy.  Advised to use daily for 2 weeks and then as needed thereafter  Return in about 4 weeks (around 10/02/2020).

## 2020-09-04 NOTE — Patient Instructions (Addendum)
Stress Fracture  A stress fracture is a small break or crack in a bone. A stress fracture can be fully broken (complete) or partially broken (incomplete). The most common sites for stress fractures are the bones in the front of your feet (metatarsals), your heel (calcaneus), and the long bone of your lower leg (tibia). What are the causes? This condition is caused by overuse or repetitive exercise, such as running. It happens when a bone cannot absorb any more shock because the muscles around it are weak. Stress fractures happen most commonly when:  You rapidly increase or start a new physical activity.  You use shoes that are worn out or do not fit properly.  You exercise on a new surface. What increases the risk? You are more likely to develop this condition if:  You have a condition that causes weak bones (osteoporosis).  You are female. Stress fractures are more likely to occur in women. What are the signs or symptoms? The most common symptom of a stress fracture is feeling pain when you are using or putting weight on the affected part of your body. The pain usually improves when you are resting. Other symptoms may include:  Swelling of the affected area.  Pain in the area when it is touched. Stress fracture pain usually develops over time. How is this diagnosed? This condition may be diagnosed by:  Your symptoms.  Your medical history.  A physical exam.  Imaging tests, such as: ? X-rays. ? MRI. ? Bone scan. How is this treated? Treatment depends on the severity of your stress fracture. It is commonly treated with resting, icing, compression, and elevation (RICE therapy). Treatment may also include:  Medicines to reduce inflammation.  A cast or a walking shoe.  Crutches.  Surgery. This is usually only in severe cases. Follow these instructions at home: If you have a cast:  Do not put pressure on any part of the cast until it is fully hardened. This may take  several hours.  Do not stick anything inside the cast to scratch your skin. Doing that increases your risk of infection.  Check the skin around the cast every day. Tell your health care provider about any concerns.  You may put lotion on dry skin around the edges of the cast. Do not put lotion on the skin underneath the cast.  Keep the cast clean.  If the cast is not waterproof: ? Do not let it get wet. ? Cover it with a watertight covering when you take a bath or a shower. If you have a walking shoe:  Wear the shoe as told by your health care provider. Remove it only as told by your health care provider.  Loosen the shoe if your toes tingle, become numb, or turn cold and blue.  Keep the shoe clean.  If the shoe is not waterproof: ? Do not let it get wet. Managing pain, stiffness, and swelling   If directed, apply ice to the injured area: ? If you have a walking shoe, remove the shoe as told by your health care provider. ? Put ice in a plastic bag. ? Place a towel between your skin and the bag or between your cast and the bag. ? Leave the ice on for 20 minutes, 2-3 times per day.  Move your toes often to avoid stiffness and to lessen swelling.  Raise (elevate) the injured area above the level of your heart while you are sitting or lying down. Activity  Rest  as directed by your health care provider. Ask your health care provider if you may do alternative exercises, such as swimming or biking, while you are healing.  Return to your normal activities as directed by your health care provider. Ask your health care provider what activities are safe for you.  Perform range-of-motion exercises only as directed by your health care provider. General instructions  Do not use the injured limb to support yourbody weight until your health care provider says that you can. Use crutches if your health care provider tells you to do so.  Do not use any products that contain nicotine or  tobacco, such as cigarettes and e-cigarettes. These can delay bone healing. If you need help quitting, ask your health care provider.  Take over-the-counter and prescription medicines only as told by your health care provider.  Keep all follow-up visits as told by your health care provider. This is important. How is this prevented?  Only wear shoes that: ? Fit well. ? Are not worn out.  Eat a healthy diet that contains vitamin D and calcium. This helps keep your bones strong. Good sources of calcium and vitamin D include: ? Low-fat dairy products such as milk, yogurt, and cheese. ? Certain fish, such as fresh or canned salmon, tuna, and sardines. ? Products that have calcium and vitamin D added to them (fortified products), such as fortified cereals or juice.  Be careful when you start a new physical activity. Give your body time to adjust.  Avoid doing only one kind of activity. Do different exercises, such as swimming and running, so that no single part of your body gets overused.  Do strength-training exercises. Contact a health care provider if:  Your pain gets worse.  You have new symptoms.  You have increased swelling. Get help right away if:  You lose feeling in the injured area. Summary  A stress fracture is a small break or crack in a bone. A stress fracture can be fully broken (complete) or partially broken (incomplete).  This condition is caused by overuse or repetitive exercise, such as running.  The most common symptom of a stress fracture is feeling pain when you are using or putting weight on the affected part of your body.  Treatment depends on the severity of your stress fracture. This information is not intended to replace advice given to you by your health care provider. Make sure you discuss any questions you have with your health care provider. Document Revised: 09/27/2017 Document Reviewed: 09/27/2017 Elsevier Patient Education  2020 [employer].              Look for an "[employer]" [employer] on [employer] or at [employer]. This will level out your hips while you are walking in the CAM boot. Wear this on the other foot around a supportive sneaker:

## 2020-09-17 ENCOUNTER — Telehealth: Payer: Self-pay | Admitting: *Deleted

## 2020-09-17 NOTE — Telephone Encounter (Signed)
Patient is having difficulty wearing boot for stress fracture, elevating as much as possible, taking pain medicine, icing but can't make foot comfortable. Please advise.

## 2020-09-19 NOTE — Telephone Encounter (Signed)
Patient called inquiring about self care instruction regarding boot and if they should continue medication, Please Advise

## 2020-09-22 ENCOUNTER — Telehealth: Payer: Self-pay | Admitting: Podiatry

## 2020-09-22 NOTE — Telephone Encounter (Signed)
I spoke with her this AM, she will loosen the strap and refill her meloxicam and take PRN.

## 2020-09-22 NOTE — Telephone Encounter (Signed)
Patient called inquiring about self care instruction regarding boot and if they should continue medication. Foot is swollen on top due to boot pressing down on area. Patient has also requested refill on pain meds, Please Advise

## 2020-10-02 ENCOUNTER — Ambulatory Visit (INDEPENDENT_AMBULATORY_CARE_PROVIDER_SITE_OTHER): Payer: Medicare Other | Admitting: Podiatry

## 2020-10-02 ENCOUNTER — Other Ambulatory Visit: Payer: Self-pay

## 2020-10-02 DIAGNOSIS — M7751 Other enthesopathy of right foot: Secondary | ICD-10-CM | POA: Diagnosis not present

## 2020-10-02 DIAGNOSIS — Q66219 Congenital metatarsus primus varus, unspecified foot: Secondary | ICD-10-CM

## 2020-10-02 DIAGNOSIS — M84374A Stress fracture, right foot, initial encounter for fracture: Secondary | ICD-10-CM | POA: Diagnosis not present

## 2020-10-02 DIAGNOSIS — M21611 Bunion of right foot: Secondary | ICD-10-CM

## 2020-10-02 DIAGNOSIS — M2041 Other hammer toe(s) (acquired), right foot: Secondary | ICD-10-CM | POA: Diagnosis not present

## 2020-10-02 DIAGNOSIS — M2011 Hallux valgus (acquired), right foot: Secondary | ICD-10-CM | POA: Diagnosis not present

## 2020-10-02 MED ORDER — TRIAMCINOLONE ACETONIDE 40 MG/ML IJ SUSP: 10.0000 mg | Freq: Once | INTRAMUSCULAR | Status: DC

## 2020-10-02 MED ORDER — DEXAMETHASONE SODIUM PHOSPHATE 4 MG/ML IJ SOLN: 2.0000 mg | Freq: Once | INTRAMUSCULAR | Status: DC

## 2020-10-02 NOTE — Patient Instructions (Signed)
Take it easy on activity for the next week  Wear the splint for 4 weeks  Today we did an injection of the 2nd toe joint for inflammation  Take the meloxicam as needed

## 2020-10-05 ENCOUNTER — Encounter: Payer: Self-pay | Admitting: Podiatry

## 2020-10-05 NOTE — Progress Notes (Signed)
  Subjective:  Patient ID: Regina Neal, female    DOB: 1956/07/31,  MRN: [id number]  Chief Complaint  Patient presents with  . Fracture    PT stated that she is still having some soreness on the top of her foot. She stated that she was not able to wear the boot.     65 y.o. female returns with the above complaint. History confirmed with patient.  The boot was very uncomfortable and was not able to wear it.  The soreness in the middle the foot is getting better it is now closer towards the end of the toes and the top of the foot.  Hurts with range of motion of the 2nd toe and when walking.  Objective:  Physical Exam: warm, good capillary refill, no trophic changes or ulcerative lesions, normal DP and PT pulses and normal sensory exam. Right Foot: Today the submetatarsal base pain has improved, she has pain with range of motion and dorsal distraction of the 2nd metatarsophalangeal joint with passive stretch.  No gross instability or dorsal drawer test.  No evidence of plantar plate rupture.  No evidence of neuroma.  Radiographs: X-ray of the left foot: Small area of transverse lucency in the base of the proximal phalanx or also cortical striations within the bone itself.  She has moderate to severe hallux valgus. Assessment:   1. Capsulitis of metatarsophalangeal (MTP) joint of right foot   2. Stress fracture of metatarsal bone of right foot, initial encounter   3. Metatarsus primus varus   4. Hammertoe of right foot   5. Hallux valgus with bunions, right      Plan:  Patient was evaluated and treated and all questions answered.   Discussed with her if she did have a stress fracture appears to be healing.  Today the symptoms are most likely centered around the 2nd metatarsophalangeal joint.  She does have evidence of neuroma or rupture of the plantar plate.  I discussed with her possible treatment options for this.  I recommended injection of corticosteroid which we performed today.   Following sterile prep with Betadine, 2 mg dexamethasone and 10 mg Kenalog was injected in the 2nd metatarsophalangeal joint after local anesthesia was administered with 1 cc each of 2% Xylocaine and 0.5 Sze Marcaine plain.  She tolerated the procedure well.  Dispensed Budin splint to keep the toe plantarflexed to offload the joint.  Return in about 1 month (around 10/30/2020).

## 2020-11-04 ENCOUNTER — Ambulatory Visit (INDEPENDENT_AMBULATORY_CARE_PROVIDER_SITE_OTHER): Payer: Medicare Other

## 2020-11-04 ENCOUNTER — Ambulatory Visit (INDEPENDENT_AMBULATORY_CARE_PROVIDER_SITE_OTHER): Payer: Medicare Other | Admitting: Podiatry

## 2020-11-04 ENCOUNTER — Other Ambulatory Visit: Payer: Self-pay

## 2020-11-04 ENCOUNTER — Encounter: Payer: Self-pay | Admitting: Podiatry

## 2020-11-04 DIAGNOSIS — M84374A Stress fracture, right foot, initial encounter for fracture: Secondary | ICD-10-CM | POA: Diagnosis not present

## 2020-11-04 DIAGNOSIS — M7751 Other enthesopathy of right foot: Secondary | ICD-10-CM

## 2020-11-04 NOTE — Progress Notes (Signed)
  Subjective:  Patient ID: Regina Neal, female    DOB: Mar 20, 1956,  MRN: [id number]  Chief Complaint  Patient presents with  . Foot Pain    Right foot pain. PT stated that she is still havig issues with her right foot     65 y.o. female returns with the above complaint. History confirmed with patient.  She has been having quite a bit more swelling and pain in the middle of the foot toward the top of the arch.  Objective:  Physical Exam: warm, good capillary refill, no trophic changes or ulcerative lesions, normal DP and PT pulses and normal sensory exam. Right Foot: Today she has mainly pain over the midfoot in the second metatarsal  Radiographs: X-ray of the left foot: She has a worsening complete fracture with callus formation of the second metatarsal now Assessment:   1. Capsulitis of metatarsophalangeal (MTP) joint of right foot   2. Stress fracture of metatarsal bone of right foot, initial encounter      Plan:  Patient was evaluated and treated and all questions answered.  Discussed with her that her stress fracture has completed and is now full fracture and is healing secondarily with bone callus.  I think she should be WBAT in the CAM boot.  We discussed the etiology and reasoning for this as well as the pathomechanics without the instability of the first ray and hallux valgus contributes to this.  I think once this heals up we should proceed with surgical correction of her foot deformity including Lapidus bunionectomy, Akin osteotomy as well as a probable Weil osteotomy and hammertoe correction with plantar plate repair to stabilize the second ray and prevent this from happening again.  We discussed the risk, benefits and potential postoperative course for this.  All questions were addressed.  I would like to see her back in 4 weeks we will take new x-rays at that visit and reevaluate her fracture.  No follow-ups on file.

## 2020-11-12 ENCOUNTER — Telehealth: Payer: Self-pay | Admitting: Podiatry

## 2020-11-12 NOTE — Telephone Encounter (Signed)
Cheaper to order one on [facility]

## 2020-11-12 NOTE — Telephone Encounter (Signed)
Called patient and gave information

## 2020-11-12 NOTE — Telephone Encounter (Signed)
Patient has requested order for knee scooter, stated she was put into boot and its very painful and uncomfortable. The knee scooter will help with moving around while applying less pressure to painful foot, Please Advise

## 2020-12-08 ENCOUNTER — Other Ambulatory Visit: Payer: Self-pay

## 2020-12-08 ENCOUNTER — Ambulatory Visit (INDEPENDENT_AMBULATORY_CARE_PROVIDER_SITE_OTHER): Payer: Medicare Other

## 2020-12-08 ENCOUNTER — Ambulatory Visit (INDEPENDENT_AMBULATORY_CARE_PROVIDER_SITE_OTHER): Payer: Medicare Other | Admitting: Podiatry

## 2020-12-08 ENCOUNTER — Ambulatory Visit: Payer: Medicare Other | Admitting: Podiatry

## 2020-12-08 DIAGNOSIS — M7751 Other enthesopathy of right foot: Secondary | ICD-10-CM | POA: Diagnosis not present

## 2020-12-08 DIAGNOSIS — M84374A Stress fracture, right foot, initial encounter for fracture: Secondary | ICD-10-CM

## 2020-12-09 NOTE — Progress Notes (Signed)
Subjective:   Patient ID: Regina Neal, female   DOB: 65 y.o.   MRN: [id number]   HPI Patient states she still having a lot of pain around her right foot and she knows that she will have to have this bunion fixed at 1 point   ROS      Objective:  Physical Exam  Neurovascular status intact with continued acute discomfort around the second MPJ right with fluid buildup around the joint surface and significant structural bunion deformity right with deviation of the hallux     Assessment:  Inflammatory capsulitis of the second MPJ right with structural deformity noted of a significant nature     Plan:  H&P reviewed condition I went ahead did a forefoot block I aspirated the second MPJ getting out clear fluid and injected carefully with 1/4 cc dexamethasone Kenalog directly into the joint along with plantar padding and advised if this not does not get better we will have to consider immobilization or physical therapy.  I also discussed her bunion I do think Dr. Lilian Kapur would be best to fix it as I do think it will probably require a proximal type osteotomy or fusion  X-rays taken today did indicate she had a stress fracture of the second metatarsal but it does appear completely healed and she does have significant bunion deformity with deviation of the hallux

## 2021-03-25 ENCOUNTER — Ambulatory Visit (INDEPENDENT_AMBULATORY_CARE_PROVIDER_SITE_OTHER): Payer: Medicare Other | Admitting: Neurology

## 2021-03-25 ENCOUNTER — Encounter: Payer: Self-pay | Admitting: Neurology

## 2021-03-25 VITALS — BP 142/81 | HR 76 | Ht 60.0 in | Wt 111.5 lb

## 2021-03-25 DIAGNOSIS — M542 Cervicalgia: Secondary | ICD-10-CM

## 2021-03-25 DIAGNOSIS — M5481 Occipital neuralgia: Secondary | ICD-10-CM | POA: Diagnosis not present

## 2021-03-25 DIAGNOSIS — G501 Atypical facial pain: Secondary | ICD-10-CM

## 2021-03-25 DIAGNOSIS — R9082 White matter disease, unspecified: Secondary | ICD-10-CM | POA: Diagnosis not present

## 2021-03-25 MED ORDER — IMIPRAMINE HCL 25 MG PO TABS: 25.0000 mg | ORAL_TABLET | Freq: Every day | ORAL | 5 refills | Status: DC

## 2021-03-25 NOTE — Progress Notes (Signed)
[facility]  PATIENT: Regina Neal DOB: 08-Apr-1956  REFERRING DOCTOR OR PCP: Scheryl Marten, MD, Nolon Stalls, PA-C SOURCE: Patient, notes from [employer], laboratory reports, imaging reports, MRI of the brain 11/24/2019 personally reviewed.  _________________________________   HISTORICAL  CHIEF COMPLAINT:  Chief Complaint  Patient presents with   New Patient (Initial Visit)    Rm 1, alone. Here for further evaluation and establish care, for trigeminal neuralgia and facial hemispasm. Pt states she's in pn everyday from her neuralgia, come days are better then others.     HISTORY OF PRESENT ILLNESS:  I had the pleasure of seeing your patient, Regina Neal, at [facility] for neurologic consultation regarding her abnormal brain MRI and concern about multiple sclerosis.  She is a 65 year old woman who was diagnosed with MS in 2008.  The diagnosis has come into question and we are asked to do a third opinion.  Around 2000, she started to experience a burning sensation in her right eye and adjacent to the orbit.  She had pain that was constant with superimposed intermittent worsening.  She was diagnosed wit trigeminal neuralgia in 2000 and had microvascular decompression that year (Dr. Garnette Czech) initially with benefit for a while.    She had recurence in 2008 and had a subsequent operation 2008 without improvement.   Multiple different medications were tried for the trigeminal neuralgia without much benefit.  As part of her evaluation for trigeminal neuralgia, MRIs of the brain were performed.  They showed white matter abnormalities.  She was diagnosed with MS in 2008 by Dr. Leotis Shames.  She does not believe she had a lumbar puncture around that time.  She was placed on Avonex initially (2 years) but due to low WBC switched to Copaxone in 2010 and stayed on until 2019 when she had insurance issues.   She then did Ocrevus for 3 infusions, last one  around December 2020.    She started to see Dr. Gaynelle Adu who questioned the diagnosis if MS.   She did an LP 08/2020 which was normal.   Based on the appearance of the MRI and the CSF results, Dr. Gaynelle Adu felt she did not have MS.     Currently, she denies difficulty with gait, strength or limb sensation.   She has altered sensation in the right face and continues to have pain in the right head, but more near the ear than in V1 or V2.    She is on baclofen and gabapentin for the atypical facial pain.   Vision is doing well and she has frequet evaluatin by Dr. Daphine Deutscher, neuro-ophthalmology.   She denies bladder issues.   No difficulty with cognition.  She has mild depression on days she has more pain but not when pain is absent or mild.     Meds tried for neuralgia pain:  Baclofen, NSAID, Lyrica, topiramate, zonisamide, Marinol, Valproic acid, tramadol, methadone, Vimpat, Levetiracetam, steroid, Horizant, Lamotrigine, duloxetine, carbamazepine, Nuedexta, Aptoiom , amitriptyline, nortriptyline, oxcarbazepine, Belbuca, Butrans, Dilantin.      Record review: Multiple notes were reviewed from neurology and primary care   Imaging review personally reviewed today: MRI of the brain 11/24/2019 shows scattered T2/FLAIR hyperintense foci predominantly in the subcortical and deep white matter.  These have an appearance more consistent with chronic microvascular ischemic change rather than demyelination.  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  There has been a trigeminal decompression surgery on the right with evidence of right retrosigmoid  craniotomy.   The trigeminal nerve appears normal..  Arterial flow in that region was normal.  MR angiogram of the brain and neck 11/24/2019 shows no stenosis  MRIs of the brain reports reports in 2012, 2014 and 2018 ([facility]) were read as being consistent with multiple sclerosis.  There is also comment that there had been no progression between  2012 and 2014 and between 2014 and 2018   Laboratory review: CSF 11/29/2019 showed normal kappa free light chains Serum 09/19/2020 showed normal B12 and hemoglobin A1c.  Hepatitis C Ab was nonreactive.    REVIEW OF SYSTEMS: Constitutional: No fevers, chills, sweats, or change in appetite Eyes: No visual changes, double vision, eye pain Ear, nose and throat: No hearing loss, ear pain, nasal congestion, sore throat Cardiovascular: No chest pain, palpitations Respiratory:  No shortness of breath at rest or with exertion.   No wheezes GastrointestinaI: No nausea, vomiting, diarrhea, abdominal pain, fecal incontinence Genitourinary:  No dysuria, urinary retention or frequency.  No nocturia. Musculoskeletal:  No neck pain, back pain Integumentary: No rash, pruritus, skin lesions Neurological: as above Psychiatric: No depression at this time.  No anxiety Endocrine: No palpitations, diaphoresis, change in appetite, change in weigh or increased thirst Hematologic/Lymphatic:  No anemia, purpura, petechiae. Allergic/Immunologic: No itchy/runny eyes, nasal congestion, recent allergic reactions, rashes  ALLERGIES: Allergies  Allergen Reactions   Buprenorphine Swelling   Verapamil Rash    HOME MEDICATIONS:  Current Outpatient Medications:    amLODipine (NORVASC) 10 MG tablet, Take 10 mg by mouth daily., Disp: , Rfl:    Ascorbic Acid (VITAMIN C) 500 MG CAPS, Take 1 capsule by mouth every other day., Disp: , Rfl:    aspirin 81 MG chewable tablet, Chew by mouth daily., Disp: , Rfl:    baclofen (LIORESAL) 20 MG tablet, Take 1 tablet by mouth 3 (three) times daily., Disp: , Rfl:    Calcium Carb-Vit D-Soy Isoflav (SOY FORMULA PO), Take by mouth., Disp: , Rfl:    calcium carbonate (OSCAL) 1500 (600 Ca) MG TABS tablet, Take by mouth daily with breakfast., Disp: , Rfl:    cholecalciferol (VITAMIN D) 1000 units tablet, Take 2,000 Units by mouth daily., Disp: , Rfl:    citalopram (CELEXA) 20 MG tablet,  Take 20 mg by mouth daily., Disp: , Rfl:    gabapentin (NEURONTIN) 600 MG tablet, Take 900 mg by mouth 3 (three) times daily., Disp: , Rfl:    meloxicam (MOBIC) 15 MG tablet, Take daily for 2 weeks then as needed daily therafter, Disp: 30 tablet, Rfl: 2   Multiple Vitamin (MULTI-VITAMIN DAILY) TABS, Take 1 tablet by mouth every other day., Disp: , Rfl:    NON FORMULARY, Apply 1 patch topically as needed. Lidoderm patch, Disp: , Rfl:    Omega-3 Fatty Acids (FISH OIL OMEGA-3 PO), Take 1 tablet by mouth every other day., Disp: , Rfl:    omeprazole (PRILOSEC) 20 MG capsule, Take 20 mg by mouth daily., Disp: , Rfl:    vitamin E 180 MG (400 UNITS) capsule, Take 1 capsule by mouth every other day., Disp: , Rfl:   Current Facility-Administered Medications:    dexamethasone (DECADRON) injection 2 mg, 2 mg, Other, Once, McDonald, Adam R, DPM   triamcinolone acetonide (KENALOG-40) injection 10 mg, 10 mg, Other, Once, McDonald, Rachelle HoraAdam R, DPM  PAST MEDICAL HISTORY: Past Medical History:  Diagnosis Date   Asthma    exercised induced   Hypertension    Multiple sclerosis (HCC)    Trigeminal neuralgia  PAST SURGICAL HISTORY: Past Surgical History:  Procedure Laterality Date   INSERTION / PLACEMENT / REVISION NEUROSTIMULATOR Right 2015    FAMILY HISTORY: Family History  Problem Relation Age of Onset   Cancer Mother    Lung cancer Father    Stroke Maternal Grandmother     SOCIAL HISTORY:  Social History   Socioeconomic History   Marital status: Legally Separated    Spouse name: Not on file   Number of children: 2   Years of education: Not on file   Highest education level: High school graduate  Occupational History   Not on file  Tobacco Use   Smoking status: Never   Smokeless tobacco: Never  Vaping Use   Vaping Use: Never used  Substance and Sexual Activity   Alcohol use: Never   Drug use: Never   Sexual activity: Not on file  Other Topics Concern   Not on file  Social  History Narrative   Lives alone   Right handed   Caffeine: 2 cups of coffee and 1 glass of tea, and half a soda a day   Social Determinants of Health   Financial Resource Strain: Not on file  Food Insecurity: Not on file  Transportation Needs: Not on file  Physical Activity: Not on file  Stress: Not on file  Social Connections: Not on file  Intimate Partner Violence: Not on file     PHYSICAL EXAM  Vitals:   03/25/21 1016  BP: (!) 142/81  Pulse: 76  Weight: 111 lb 8 oz (50.6 kg)  Height: 5' (1.524 m)    Body mass index is 21.78 kg/m.   General: The patient is well-developed and well-nourished and in no acute distress  HEENT:  Head is [place]/AT.  Sclera are anicteric.  Funduscopic exam shows normal optic discs and retinal vessels.  Neck: No carotid bruits are noted.  The neck is nontender.  Cardiovascular: The heart has a regular rate and rhythm with a normal S1 and S2. There were no murmurs, gallops or rubs.    Skin: Extremities are without rash or  edema.  Musculoskeletal:  Back is nontender  Neurologic Exam  Mental status: The patient is alert and oriented x 3 at the time of the examination. The patient has apparent normal recent and remote memory, with an apparently normal attention span and concentration ability.   Speech is normal.  Cranial nerves: Extraocular movements are full. Pupils are equal, round, and reactive to light and accomodation.  Visual fields are full.  Facial symmetry is present. There is good facial sensation to soft touch bilaterally.Facial strength is normal.  Trapezius and sternocleidomastoid strength is normal. No dysarthria is noted.  The tongue is midline, and the patient has symmetric elevation of the soft palate. No obvious hearing deficits are noted.  Motor:  Muscle bulk is normal.   Tone is normal. Strength is  5 / 5 in all 4 extremities.   Sensory: Sensory testing is intact to pinprick, soft touch and vibration sensation in all 4  extremities.  Coordination: Cerebellar testing reveals good finger-nose-finger and heel-to-shin bilaterally.  Gait and station: Station is normal.   Gait is normal. Tandem gait is normal for age. Romberg is negative.   Reflexes: Deep tendon reflexes are symmetric and normal bilaterally.   Plantar responses are flexor.    ASSESSMENT AND PLAN  White matter abnormality on MRI of brain  Atypical facial pain  Occipital neuralgia of right side - Plan: Ambulatory referral to Physical  Therapy  Neck pain - Plan: Ambulatory referral to Physical Therapy  In summary, Ms. Astacio is a 65 year old woman with white matter changes on MRI who was diagnosed with MS in 2008.  She has been on Avonex, Copaxone and Ocrevus.  MRIs between 2012 and 2018 showed no new lesions.  I believe the likelihood that she has MS is small, probably 5 to 10% given the appearance of the MRI with lesions much more compatible with chronic microvascular ischemic change than with demyelination.  Therefore, I would not recommend treatment with a disease modifying therapy.  Even if she had MS, it would certainly be nonaggressive as she has a near normal examination.  She has tried and failed multiple different medications for her atypical facial pain.  Current symptoms are more typical for right occipital neuralgia.  She does have tenderness over the splenius capitis muscle on the right that overlies that nerve.  The distribution is not typical for trigeminal neuralgia.  I will have her try a tricyclic, imipramine 25 mg, to see if that helps.  It may also help her insomnia.  Additionally, due to the neck pain she would like to do physical therapy and I have requested that.  Due to the low likelihood of MS, I did not schedule a follow-up visit but could see her back if she has new neurologic symptoms.  Thank you for asking me to see Ms. [facility].  Please let me know if I can be of further assistance with her or other patients in the  future.   Zaydenn Balaguer A. Epimenio Foot, MD, [facility] 03/25/2021, 10:39 AM Certified in Neurology, Clinical Neurophysiology, Sleep Medicine and Neuroimaging  [facility] [address], [address] [place], [place] [address] [phone]

## 2021-04-14 ENCOUNTER — Ambulatory Visit (HOSPITAL_COMMUNITY): Payer: Medicare Other | Attending: Neurology | Admitting: Physical Therapy

## 2021-04-14 ENCOUNTER — Encounter (HOSPITAL_COMMUNITY): Payer: Self-pay | Admitting: Physical Therapy

## 2021-04-14 ENCOUNTER — Other Ambulatory Visit: Payer: Self-pay

## 2021-04-14 DIAGNOSIS — R29898 Other symptoms and signs involving the musculoskeletal system: Secondary | ICD-10-CM | POA: Diagnosis present

## 2021-04-14 DIAGNOSIS — M542 Cervicalgia: Secondary | ICD-10-CM | POA: Diagnosis not present

## 2021-04-14 DIAGNOSIS — R2689 Other abnormalities of gait and mobility: Secondary | ICD-10-CM | POA: Insufficient documentation

## 2021-04-14 NOTE — Patient Instructions (Signed)
Access Code: [email or web address] URL: [email or web address] Date: 04/14/2021 Prepared by: Greig Castilla Zaelyn Noack  Exercises Supine Chin Tuck - 3-5 x daily - 7 x weekly - 2 sets - 10 reps - 2 second hold

## 2021-04-14 NOTE — Therapy (Signed)
Regina Neal, [place], [address] Phone: [phone]   Fax:  [phone]  Physical Therapy Evaluation  Patient Details  Name: Regina Neal MRN: [id number] Date of Birth: 10-24-1955 Referring Provider (PT): Arlice Colt MD   Encounter Date: 04/14/2021   PT End of Session - 04/14/21 1533     Visit Number 1    Number of Visits 8    Date for PT Re-Evaluation 05/12/21    Authorization Type Primary: medicare Secondary generic commercial    PT Start Time 1448    PT Stop Time 1527    PT Time Calculation (min) 39 min    Activity Tolerance Patient tolerated treatment well    Behavior During Therapy [facility] for tasks assessed/performed             Past Medical History:  Diagnosis Date   Asthma    exercised induced   Hypertension    Multiple sclerosis ([place])    Trigeminal neuralgia     Past Surgical History:  Procedure Laterality Date   INSERTION / PLACEMENT / REVISION NEUROSTIMULATOR Right 2015    There were no vitals filed for this visit.    Subjective Assessment - 04/14/21 1448     Subjective Patient is a 65 y.o. female who presents to physical therapy with c/o neck pain. Patient states she has occipital neuralgia. Patient states symptoms began with insidious onset about 6 months ago. She has trigeminal neuralgia. She just assumed it was migraines but meds didn't help. This pain is different than previous pains. Symptoms increase with movement. Symptoms unrelieved with laying down. She has not found anything that assists with symptoms. Patient states 2 neck surgeries - microvascular decompression. Pain behind ear and into head, behind eyebrow, around eye. Patient states her main goal is to feel better.    Limitations House hold activities    Patient Stated Goals to feel better    Currently in Pain? Yes    Pain Score 5     Pain Location Neck    Pain Orientation Right    Pain Descriptors / Indicators  Burning;Throbbing;Aching    Pain Type Chronic pain    Pain Onset More than a month ago    Pain Frequency Constant    Aggravating Factors  movement    Pain Relieving Factors none                [facility] PT Assessment - 04/14/21 0001       Assessment   Medical Diagnosis Neck pain, occipital neuralgia    Referring Provider (PT) Arlice Colt MD    Onset Date/Surgical Date 10/15/20    Next MD Visit none scheduled    Prior Therapy none      Precautions   Precautions None      Restrictions   Weight Bearing Restrictions No      Balance Screen   Has the patient fallen in the past 6 months No    Has the patient had a decrease in activity level because of a fear of falling?  No    Is the patient reluctant to leave their home because of a fear of falling?  No      Prior Function   Level of Independence Independent    Vocation Retired      Associate Professor   Overall Cognitive Status Within Functional Limits for tasks assessed      Observation/Other Assessments   Observations Ambulates without AD  Focus on Therapeutic Outcomes (FOTO)  59% function      Sensation   Light Touch Appears Intact   decreased C2 R     ROM / Strength   AROM / PROM / Strength AROM;Strength      AROM   Overall AROM Comments Pain in R UT/levator scapulae/ scalenes with all testing    AROM Assessment Site Cervical    Cervical Flexion 0% limited    Cervical Extension 75% limited    Cervical - Right Side Bend 75% limited    Cervical - Left Side Bend 75% limited    Cervical - Right Rotation 75% limited    Cervical - Left Rotation 50% limited      Strength   Strength Assessment Site Shoulder;Elbow;Wrist;Hand    Right/Left Shoulder Right;Left    Right Shoulder Flexion 5/5    Right Shoulder Extension 5/5    Left Shoulder Flexion 5/5    Left Shoulder ABduction 5/5    Right/Left Elbow Right;Left    Right Elbow Flexion 5/5    Right Elbow Extension 5/5    Left Elbow Flexion 5/5    Left Elbow Extension  5/5    Right/Left Wrist Right;Left    Right Wrist Flexion 5/5    Right Wrist Extension 5/5    Left Wrist Flexion 5/5    Left Wrist Extension 5/5    Right/Left hand Right;Left    Right Hand Gross Grasp Functional    Left Hand Gross Grasp Functional      Palpation   Spinal mobility grossly hypomobile and tender cervical spine with symptom reproduction on R    Palpation comment TTP R cervical paraspinals, suboccipitals, levator scap, UT, SCM, scalenes                        Objective measurements completed on examination: See above findings.       [facility] Adult PT Treatment/Exercise - 04/14/21 0001       Exercises   Exercises Neck      Neck Exercises: Supine   Neck Retraction 10 reps;3 secs                    PT Education - 04/14/21 1447     Education Details Patient educated on exam findings, POC, scope of PT, HEP    Person(s) Educated Patient    Methods Explanation;Handout    Comprehension Verbalized understanding              PT Short Term Goals - 04/14/21 1537       PT SHORT TERM GOAL #1   Title Patient will be independent with HEP in order to improve functional outcomes.    Time 2    Period Weeks    Status New    Target Date 04/28/21      PT SHORT TERM GOAL #2   Title Patient will report at least 25% improvement in symptoms for improved quality of life.    Time 2    Period Weeks    Status New    Target Date 04/28/21               PT Long Term Goals - 04/14/21 1538       PT LONG TERM GOAL #1   Title Patient will report at least 75% improvement in symptoms for improved quality of life.    Time 4    Period Weeks    Status New  Target Date 05/12/21      PT LONG TERM GOAL #2   Title Patient will improve FOTO score by at least 10 points in order to indicate improved tolerance to activity.    Time 4    Period Weeks    Status New    Target Date 05/12/21      PT LONG TERM GOAL #3   Title Patient will demonstrate  at least 50% improvement in cervical ROM in all restricted planes for improved ability to move head while driving.    Time 4    Period Weeks    Status New    Target Date 05/12/21                    Plan - 04/14/21 1534     Clinical Impression Statement Patient is a 65 y.o. female who presents to physical therapy with c/o neck pain and headaches. She presents with pain limited deficits in cervical spine strength, ROM, hyperactive and tender musculature, spinal mobility and functional mobility with ADL. She is having to modify and restrict ADL as indicated by FOTO score as well as subjective information and objective measures which is affecting overall participation. Patient will benefit from skilled physical therapy in order to improve function and reduce impairment.    Personal Factors and Comorbidities Comorbidity 1    Comorbidities chronic neck pain/surgery    Examination-Activity Limitations Reach Overhead;Bend;Lift    Examination-Participation Restrictions Cleaning;Laundry;Yard Work;Community Activity;Driving    Stability/Clinical Decision Making Stable/Uncomplicated    Clinical Decision Making Low    Rehab Potential Good    PT Frequency 2x / week    PT Duration 4 weeks    PT Treatment/Interventions ADLs/Self Care Home Management;Aquatic Therapy;Electrical Stimulation;Iontophoresis 54m/ml Dexamethasone;Moist Heat;Traction;Ultrasound;DME Instruction;Gait training;Stair training;Functional mobility training;Therapeutic activities;Therapeutic exercise;Balance training;Neuromuscular re-education;Patient/family education;Orthotic Fit/Training;Manual techniques;Manual lymph drainage;Compression bandaging;Scar mobilization;Passive range of motion;Dry needling;Energy conservation;Splinting;Taping;Spinal Manipulations;Joint Manipulations    PT Next Visit Plan manual for pain/ mobility, follow up with retractions, postural strength, cervical mobility    PT Home Exercise Plan 8/16 c/p ret     Consulted and Agree with Plan of Care Patient             Patient will benefit from skilled therapeutic intervention in order to improve the following deficits and impairments:  Decreased range of motion, Increased muscle spasms, Decreased activity tolerance, Hypomobility, Improper body mechanics, Decreased mobility, Postural dysfunction  Visit Diagnosis: Cervicalgia  Other abnormalities of gait and mobility  Other symptoms and signs involving the musculoskeletal system     Problem List There are no problems to display for this patient.   3:40 PM, 04/14/21 AMearl LatinPT, DPT Physical Therapist [place] [address]Phone: [phone]  Fax:  [phone] Name: CMONAY HOULTONMRN: [id number] of Birth: 405-06-1956

## 2021-04-16 ENCOUNTER — Encounter (HOSPITAL_COMMUNITY): Payer: Self-pay

## 2021-04-16 ENCOUNTER — Other Ambulatory Visit: Payer: Self-pay

## 2021-04-16 ENCOUNTER — Ambulatory Visit (HOSPITAL_COMMUNITY): Payer: Medicare Other

## 2021-04-16 DIAGNOSIS — R29898 Other symptoms and signs involving the musculoskeletal system: Secondary | ICD-10-CM

## 2021-04-16 DIAGNOSIS — R2689 Other abnormalities of gait and mobility: Secondary | ICD-10-CM

## 2021-04-16 DIAGNOSIS — M542 Cervicalgia: Secondary | ICD-10-CM | POA: Diagnosis not present

## 2021-04-16 NOTE — Therapy (Signed)
[facility] [address] [place], [place], [address] Phone: [phone]   Fax:  [phone]  Physical Therapy Treatment  Patient Details  Name: Regina Neal MRN: [id number] Date of Birth: 05/01/1956 Referring Provider (PT): Arlice Colt MD   Encounter Date: 04/16/2021   PT End of Session - 04/16/21 1318     Visit Number 2    Number of Visits 8    Date for PT Re-Evaluation 05/12/21    Authorization Type Primary: medicare Secondary generic commercial    PT Start Time 9169    PT Stop Time 1346    PT Time Calculation (min) 42 min    Activity Tolerance Patient tolerated treatment well;No increased pain    Behavior During Therapy WFL for tasks assessed/performed             Past Medical History:  Diagnosis Date   Asthma    exercised induced   Hypertension    Multiple sclerosis ([place])    Trigeminal neuralgia     Past Surgical History:  Procedure Laterality Date   INSERTION / PLACEMENT / REVISION NEUROSTIMULATOR Right 2015    There were no vitals filed for this visit.   Subjective Assessment - 04/16/21 1259     Subjective Pt reports having a bad day, 7/10 pain on R side from occipital and trigeminal nerualgia pain. Pt reports fatiguing quickly with neck retraction HEP, only able to tolerate 4 reps    Limitations House hold activities    Patient Stated Goals to feel better    Currently in Pain? Yes    Pain Score 7     Pain Location Neck    Pain Orientation Right    Pain Descriptors / Indicators Burning;Throbbing    Pain Type Chronic pain    Pain Onset More than a month ago    Pain Frequency Constant               [facility] Adult PT Treatment/Exercise - 04/16/21 0001       Neck Exercises: Supine   Neck Retraction 10 reps;3 secs    Cervical Rotation 10 reps    Cervical Rotation Limitations R/L and CW/CCW, focus on smooth movement, avoiding breath holding      Manual Therapy   Manual Therapy Soft tissue  mobilization    Manual therapy comments Completed seperate from rest of treatment interventions    Soft tissue mobilization Pt supine with wedge under LEs, STM to R upper trap, scalenes, suboccipital region with moderate to strong deep pressure to reduce muscle tension, improve extensiblity, and alleviate nerve pain complaints                    PT Education - 04/16/21 1318     Education Details Reviewed HEP and goals, exercise technique, posture, relaxation techniques, avoiding breath holding, supine/supported sitting positions to reduce tension and promote relief    Person(s) Educated Patient    Methods Explanation;Demonstration;Verbal cues    Comprehension Verbalized understanding;Returned demonstration              PT Short Term Goals - 04/16/21 1309       PT SHORT TERM GOAL #1   Title Patient will be independent with HEP in order to improve functional outcomes.    Time 2    Period Weeks    Status On-going    Target Date 04/28/21      PT SHORT TERM GOAL #2   Title Patient will  report at least 25% improvement in symptoms for improved quality of life.    Time 2    Period Weeks    Status On-going    Target Date 04/28/21               PT Long Term Goals - 04/16/21 1309       PT LONG TERM GOAL #1   Title Patient will report at least 75% improvement in symptoms for improved quality of life.    Time 4    Period Weeks    Status On-going      PT LONG TERM GOAL #2   Title Patient will improve FOTO score by at least 10 points in order to indicate improved tolerance to activity.    Time 4    Period Weeks    Status On-going      PT LONG TERM GOAL #3   Title Patient will demonstrate at least 50% improvement in cervical ROM in all restricted planes for improved ability to move head while driving.    Time 4    Period Weeks    Status On-going                   Plan - 04/16/21 1319     Clinical Impression Statement Pt requires increased  concentration and cues for cervical retraction and AROM while supine with head supported on pillow. Pt demonstrates limited cervical AROM with R side being more limited that L. Pt tolerates manual STM to R upper trap, levator, scalenes and suboccipital region, prefers moderate to strong pressure to alleviate nerve pain complaints, promote muscle relaxation and improve tissue extensibility. Pt reports significant improvement in pain at EOS, not rated 0-10. Reviewed HEP again and educated pt on pain alleviating factors at home to minimize soreness from therapy. Continue to progress as able.    Personal Factors and Comorbidities Comorbidity 1    Comorbidities chronic neck pain/surgery    Examination-Activity Limitations Reach Overhead;Bend;Lift    Examination-Participation Restrictions Cleaning;Laundry;Yard Work;Community Activity;Driving    Stability/Clinical Decision Making Stable/Uncomplicated    Rehab Potential Good    PT Frequency 2x / week    PT Duration 4 weeks    PT Treatment/Interventions ADLs/Self Care Home Management;Aquatic Therapy;Electrical Stimulation;Iontophoresis 96m/ml Dexamethasone;Moist Heat;Traction;Ultrasound;DME Instruction;Gait training;Stair training;Functional mobility training;Therapeutic activities;Therapeutic exercise;Balance training;Neuromuscular re-education;Patient/family education;Orthotic Fit/Training;Manual techniques;Manual lymph drainage;Compression bandaging;Scar mobilization;Passive range of motion;Dry needling;Energy conservation;Splinting;Taping;Spinal Manipulations;Joint Manipulations    PT Next Visit Plan f/u with STM response. Continue cervical mobility in supported position, manual for pain/ mobility, postural strength, cues to avoid breath holding    PT Home Exercise Plan 8/16 c/p ret    Consulted and Agree with Plan of Care Patient             Patient will benefit from skilled therapeutic intervention in order to improve the following deficits and  impairments:  Decreased range of motion, Increased muscle spasms, Decreased activity tolerance, Hypomobility, Improper body mechanics, Decreased mobility, Postural dysfunction  Visit Diagnosis: Cervicalgia  Other abnormalities of gait and mobility  Other symptoms and signs involving the musculoskeletal system     Problem List There are no problems to display for this patient.    TTalbot GrumblingPT, DPT 04/16/21, 2:06 PM    [facility] [address]Phone: [phone]  Fax:  [phone] Name: CTAJAE MAIOLOMRN: [id number] of Birth: 4March 02, 1957

## 2021-04-20 ENCOUNTER — Telehealth (HOSPITAL_COMMUNITY): Payer: Self-pay | Admitting: Physical Therapy

## 2021-04-20 ENCOUNTER — Ambulatory Visit (HOSPITAL_COMMUNITY): Payer: Medicare Other | Admitting: Physical Therapy

## 2021-04-20 NOTE — Telephone Encounter (Signed)
She has been working out at home and has gotten very sore she will not be here today

## 2021-04-23 ENCOUNTER — Ambulatory Visit (HOSPITAL_COMMUNITY): Payer: Medicare Other | Admitting: Physical Therapy

## 2021-04-23 ENCOUNTER — Other Ambulatory Visit: Payer: Self-pay

## 2021-04-23 DIAGNOSIS — R2689 Other abnormalities of gait and mobility: Secondary | ICD-10-CM

## 2021-04-23 DIAGNOSIS — M542 Cervicalgia: Secondary | ICD-10-CM

## 2021-04-23 DIAGNOSIS — R29898 Other symptoms and signs involving the musculoskeletal system: Secondary | ICD-10-CM

## 2021-04-23 NOTE — Therapy (Signed)
[facility] [address] [place], [place], [address] Phone: [phone]   Fax:  [phone]  Physical Therapy Treatment  Patient Details  Name: Regina Neal MRN: [id number] Date of Birth: 03-27-1956 Referring Provider (PT): Arlice Colt MD   Encounter Date: 04/23/2021   PT End of Session - 04/23/21 1540     Visit Number 3    Number of Visits 8    Date for PT Re-Evaluation 05/12/21    Authorization Type Primary: medicare Secondary generic commercial    PT Start Time 1450    PT Stop Time 1529    PT Time Calculation (min) 39 min    Activity Tolerance Patient tolerated treatment well;No increased pain    Behavior During Therapy WFL for tasks assessed/performed             Past Medical History:  Diagnosis Date   Asthma    exercised induced   Hypertension    Multiple sclerosis ([place])    Trigeminal neuralgia     Past Surgical History:  Procedure Laterality Date   INSERTION / PLACEMENT / REVISION NEUROSTIMULATOR Right 2015    There were no vitals filed for this visit.   Subjective Assessment - 04/23/21 1452     Subjective pt states she is not doing too well betwenn her trigeminal neuralgia pain and her cervical pain.  Reports her pain as 10/10 however denies need to go to emerency room.  States she has been doing self manual and it may have been too much.    Currently in Pain? Yes    Pain Score 10-Worst pain ever    Pain Location Neck    Pain Orientation Right                               [facility] Adult PT Treatment/Exercise - 04/23/21 0001       Neck Exercises: Seated   Neck Retraction 10 reps    Other Seated Exercise 10      Neck Exercises: Supine   Other Supine Exercise cervical excurions 5X, thoracic excursions with UE movements 5X      Manual Therapy   Manual Therapy Soft tissue mobilization    Manual therapy comments Completed seperate from rest of treatment interventions    Soft  tissue mobilization Pt seated with step under feet,  STM to R upper trap, scalenes                      PT Short Term Goals - 04/16/21 1309       PT SHORT TERM GOAL #1   Title Patient will be independent with HEP in order to improve functional outcomes.    Time 2    Period Weeks    Status On-going    Target Date 04/28/21      PT SHORT TERM GOAL #2   Title Patient will report at least 25% improvement in symptoms for improved quality of life.    Time 2    Period Weeks    Status On-going    Target Date 04/28/21               PT Long Term Goals - 04/16/21 1309       PT LONG TERM GOAL #1   Title Patient will report at least 75% improvement in symptoms for improved quality of life.    Time 4  Period Weeks    Status On-going      PT LONG TERM GOAL #2   Title Patient will improve FOTO score by at least 10 points in order to indicate improved tolerance to activity.    Time 4    Period Weeks    Status On-going      PT LONG TERM GOAL #3   Title Patient will demonstrate at least 50% improvement in cervical ROM in all restricted planes for improved ability to move head while driving.    Time 4    Period Weeks    Status On-going                   Plan - 04/23/21 1539     Clinical Impression Statement Worked on cervical and began thoracic mobilization in seated position today.  Pt with best form completing cervical retractions against door with cues to tuck chin not look down.  Manual completed this session in seated position to bil UT and scalenes.  Minimal tightness in Lt with multiple spasms in Rt.  Pt reported reduction in pain to 8/10 at end of session.    Personal Factors and Comorbidities Comorbidity 1    Comorbidities chronic neck pain/surgery    Examination-Activity Limitations Reach Overhead;Bend;Lift    Examination-Participation Restrictions Cleaning;Laundry;Yard Work;Community Activity;Driving    Stability/Clinical Decision Making  Stable/Uncomplicated    Rehab Potential Good    PT Frequency 2x / week    PT Duration 4 weeks    PT Treatment/Interventions ADLs/Self Care Home Management;Aquatic Therapy;Electrical Stimulation;Iontophoresis 57m/ml Dexamethasone;Moist Heat;Traction;Ultrasound;DME Instruction;Gait training;Stair training;Functional mobility training;Therapeutic activities;Therapeutic exercise;Balance training;Neuromuscular re-education;Patient/family education;Orthotic Fit/Training;Manual techniques;Manual lymph drainage;Compression bandaging;Scar mobilization;Passive range of motion;Dry needling;Energy conservation;Splinting;Taping;Spinal Manipulations;Joint Manipulations    PT Next Visit Plan continue cervical mobility and manual for pain/ mobility, postural strength, cues to avoid breath holding    PT Home Exercise Plan 8/16 Regina/p ret    Consulted and Agree with Plan of Care Patient             Patient will benefit from skilled therapeutic intervention in order to improve the following deficits and impairments:  Decreased range of motion, Increased muscle spasms, Decreased activity tolerance, Hypomobility, Improper body mechanics, Decreased mobility, Postural dysfunction  Visit Diagnosis: Other abnormalities of gait and mobility  Other symptoms and signs involving the musculoskeletal system  Cervicalgia     Problem List There are no problems to display for this patient.  ATeena Irani PTA/CLT [phone] FTeena Irani8/25/2022, 3:41 PM  [facility] DriveSP[place] [address]Phone: [phone]  Fax:  [phone] Name: Regina GECKMRN: [id number] of Birth: 4June 10, 1957

## 2021-04-27 ENCOUNTER — Encounter (HOSPITAL_COMMUNITY): Payer: Self-pay | Admitting: Physical Therapy

## 2021-04-27 ENCOUNTER — Ambulatory Visit (HOSPITAL_COMMUNITY): Payer: Medicare Other | Admitting: Physical Therapy

## 2021-04-27 ENCOUNTER — Other Ambulatory Visit: Payer: Self-pay

## 2021-04-27 DIAGNOSIS — M542 Cervicalgia: Secondary | ICD-10-CM | POA: Diagnosis not present

## 2021-04-27 DIAGNOSIS — R2689 Other abnormalities of gait and mobility: Secondary | ICD-10-CM

## 2021-04-27 DIAGNOSIS — R29898 Other symptoms and signs involving the musculoskeletal system: Secondary | ICD-10-CM

## 2021-04-27 NOTE — Therapy (Signed)
[facility] [address] [place], [place], [address] Phone: [phone]   Fax:  [phone]  Physical Therapy Treatment  Patient Details  Name: Regina Neal MRN: [id number] Date of Birth: [birth year] Referring Provider (PT): Arlice Colt MD   Encounter Date: 04/27/2021   PT End of Session - 04/27/21 1131     Visit Number 4    Number of Visits 8    Date for PT Re-Evaluation 05/12/21    Authorization Type Primary: medicare Secondary generic commercial    PT Start Time 7341    PT Stop Time 1210    PT Time Calculation (min) 39 min    Activity Tolerance Patient tolerated treatment well;No increased pain    Behavior During Therapy WFL for tasks assessed/performed             Past Medical History:  Diagnosis Date   Asthma    exercised induced   Hypertension    Multiple sclerosis ([place])    Trigeminal neuralgia     Past Surgical History:  Procedure Laterality Date   INSERTION / PLACEMENT / REVISION NEUROSTIMULATOR Right 2015    There were no vitals filed for this visit.   Subjective Assessment - 04/27/21 1138     Subjective States that she felt the UT and that helped her neck pain but that did nothing for her trigeminal pain. States that she felt the massage to her upper neck felt good but she may have done too many. Current pain is 7/10 with it right eyebrow and then back to the back of the head. Pain is described as pounding and sharp.    Currently in Pain? Yes    Pain Score 7     Pain Location Neck    Pain Orientation Right    Pain Descriptors / Indicators Aching;Throbbing    Pain Radiating Towards around entire right side                [facility] PT Assessment - 04/27/21 0001       Assessment   Medical Diagnosis Neck pain, occipital neuralgia    Referring Provider (PT) Arlice Colt MD    Onset Date/Surgical Date 10/15/20                           [facility] Adult PT Treatment/Exercise - 04/27/21  0001       Transfers   Five time sit to stand comments  --      Neck Exercises: Standing   Neck Retraction 10 reps;3 secs   verbal and tactile cues at wall     Neck Exercises: Supine   Other Supine Exercise desensitization to right facial / nneck regions - 10 minutes total      Modalities   Modalities Moist Heat      Moist Heat Therapy   Number Minutes Moist Heat 12 Minutes    Moist Heat Location Cervical      Manual Therapy   Manual Therapy Manual Traction;Soft tissue mobilization    Manual therapy comments Completed seperate from rest of treatment interventions    Soft tissue mobilization to masseter, cervical paraspinals, suboccipitals, and termporalis muscle - not tolerated well    Manual Traction to c-spine - tolerated well - 8 minutes total                    PT Education - 04/27/21 1144     Education Details on  rationale for exercises, on anatomy, on how to perform chin tuck properly, verbal and tacilte cues, cervical device    Person(s) Educated Patient    Methods Explanation    Comprehension Verbalized understanding              PT Short Term Goals - 04/16/21 1309       PT SHORT TERM GOAL #1   Title Patient will be independent with HEP in order to improve functional outcomes.    Time 2    Period Weeks    Status On-going    Target Date 04/28/21      PT SHORT TERM GOAL #2   Title Patient will report at least 25% improvement in symptoms for improved quality of life.    Time 2    Period Weeks    Status On-going    Target Date 04/28/21               PT Long Term Goals - 04/16/21 1309       PT LONG TERM GOAL #1   Title Patient will report at least 75% improvement in symptoms for improved quality of life.    Time 4    Period Weeks    Status On-going      PT LONG TERM GOAL #2   Title Patient will improve FOTO score by at least 10 points in order to indicate improved tolerance to activity.    Time 4    Period Weeks    Status  On-going      PT LONG TERM GOAL #3   Title Patient will demonstrate at least 50% improvement in cervical ROM in all restricted planes for improved ability to move head while driving.    Time 4    Period Weeks    Status On-going                   Plan - 04/27/21 1214     Clinical Impression Statement Session focused on education secondary to concerns of hurting herself with previous neck exercises. Educated patient on anatomy and rationale for all exercises. Provided cues for chin tucks and how to perform correctly. Patient very sensitive to touch and educated and desensitization of facial/cervical muscles. Educated in self traction device secondary to positive response with traction.    Personal Factors and Comorbidities Comorbidity 1    Comorbidities chronic neck pain/surgery    Examination-Activity Limitations Reach Overhead;Bend;Lift    Examination-Participation Restrictions Cleaning;Laundry;Yard Work;Community Activity;Driving    Stability/Clinical Decision Making Stable/Uncomplicated    Rehab Potential Good    PT Frequency 2x / week    PT Duration 4 weeks    PT Treatment/Interventions ADLs/Self Care Home Management;Aquatic Therapy;Electrical Stimulation;Iontophoresis 47m/ml Dexamethasone;Moist Heat;Traction;Ultrasound;DME Instruction;Gait training;Stair training;Functional mobility training;Therapeutic activities;Therapeutic exercise;Balance training;Neuromuscular re-education;Patient/family education;Orthotic Fit/Training;Manual techniques;Manual lymph drainage;Compression bandaging;Scar mobilization;Passive range of motion;Dry needling;Energy conservation;Splinting;Taping;Spinal Manipulations;Joint Manipulations    PT Next Visit Plan continue cervical mobility and manual for pain/ mobility, postural strength, cues to avoid breath holding    PT Home Exercise Plan 8/16 c/p ret; 8/29 desensitization    Consulted and Agree with Plan of Care Patient             Patient will  benefit from skilled therapeutic intervention in order to improve the following deficits and impairments:  Decreased range of motion, Increased muscle spasms, Decreased activity tolerance, Hypomobility, Improper body mechanics, Decreased mobility, Postural dysfunction  Visit Diagnosis: Other abnormalities of gait and mobility  Other symptoms and signs involving  the musculoskeletal system  Cervicalgia     Problem List There are no problems to display for this patient.   12:15 PM, 04/27/21 Jerene Pitch, DPT Physical Therapy with [facility]  [phone] office    [facility] [address] [place], [place], [address] Phone: [phone]   Fax:  [phone]  Name: Regina Neal MRN: [id number] Date of Birth: 06-20-1956

## 2021-04-30 ENCOUNTER — Other Ambulatory Visit: Payer: Self-pay

## 2021-04-30 ENCOUNTER — Ambulatory Visit (HOSPITAL_COMMUNITY): Payer: Medicare Other | Attending: Neurology | Admitting: Physical Therapy

## 2021-04-30 ENCOUNTER — Encounter (HOSPITAL_COMMUNITY): Payer: Self-pay | Admitting: Physical Therapy

## 2021-04-30 DIAGNOSIS — R2689 Other abnormalities of gait and mobility: Secondary | ICD-10-CM | POA: Diagnosis present

## 2021-04-30 DIAGNOSIS — M542 Cervicalgia: Secondary | ICD-10-CM | POA: Diagnosis present

## 2021-04-30 DIAGNOSIS — R29898 Other symptoms and signs involving the musculoskeletal system: Secondary | ICD-10-CM | POA: Diagnosis present

## 2021-04-30 NOTE — Therapy (Signed)
[facility] [address] [place], [place], [address] Phone: [phone]   Fax:  [phone]  Physical Therapy Treatment  Patient Details  Name: Regina Neal MRN: [id number] Date of Birth: 13-Jan-1956 Referring Provider (PT): Arlice Colt MD   Encounter Date: 04/30/2021   PT End of Session - 04/30/21 1446     Visit Number 5    Number of Visits 8    Date for PT Re-Evaluation 05/12/21    Authorization Type Primary: medicare Secondary generic commercial    PT Start Time 7619    PT Stop Time 1525    PT Time Calculation (min) 38 min    Activity Tolerance Patient tolerated treatment well;No increased pain    Behavior During Therapy WFL for tasks assessed/performed             Past Medical History:  Diagnosis Date   Asthma    exercised induced   Hypertension    Multiple sclerosis ([place])    Trigeminal neuralgia     Past Surgical History:  Procedure Laterality Date   INSERTION / PLACEMENT / REVISION NEUROSTIMULATOR Right 2015    There were no vitals filed for this visit.   Subjective Assessment - 04/30/21 1449     Subjective States she felt better after last session. States that night around 1030 she had some soreness on the right side. States that on Tuesday she had a good day but yesterday she was doing a lot and is not hurting today as much as she thought she would.    Currently in Pain? Yes    Pain Score 6     Pain Location Neck    Pain Orientation Right    Pain Descriptors / Indicators Aching    Pain Radiating Towards to right ear                [facility] PT Assessment - 04/30/21 0001       Assessment   Medical Diagnosis Neck pain, occipital neuralgia    Referring Provider (PT) Arlice Colt MD    Onset Date/Surgical Date 10/15/20                           [facility] Adult PT Treatment/Exercise - 04/30/21 0001       Neck Exercises: Supine   Other Supine Exercise pelvic tilts 2 minutes     Other Supine Exercise LTR 3 minutes wiht 10" hlds and opposite head turns;DKC 2 minutes total - 15 second holds      Moist Heat Therapy   Number Minutes Moist Heat 12 Minutes    Moist Heat Location Cervical      Manual Therapy   Manual Therapy Manual Traction;Soft tissue mobilization;Joint mobilization    Manual therapy comments Completed seperate from rest of treatment interventions    Joint Mobilization medial glide B TMJ, medial glides to C spine    Soft tissue mobilization STM to masseter B, UT, cervical parspinals    Manual Traction to c-spine - tolerated well - 8 minutes total                    PT Education - 04/30/21 1519     Education Details on fascial restrictions, relation to cervical restrictions and current presentation.    Person(s) Educated Patient    Methods Explanation    Comprehension Verbalized understanding  PT Short Term Goals - 04/16/21 1309       PT SHORT TERM GOAL #1   Title Patient will be independent with HEP in order to improve functional outcomes.    Time 2    Period Weeks    Status On-going    Target Date 04/28/21      PT SHORT TERM GOAL #2   Title Patient will report at least 25% improvement in symptoms for improved quality of life.    Time 2    Period Weeks    Status On-going    Target Date 04/28/21               PT Long Term Goals - 04/16/21 1309       PT LONG TERM GOAL #1   Title Patient will report at least 75% improvement in symptoms for improved quality of life.    Time 4    Period Weeks    Status On-going      PT LONG TERM GOAL #2   Title Patient will improve FOTO score by at least 10 points in order to indicate improved tolerance to activity.    Time 4    Period Weeks    Status On-going      PT LONG TERM GOAL #3   Title Patient will demonstrate at least 50% improvement in cervical ROM in all restricted planes for improved ability to move head while driving.    Time 4    Period Weeks     Status On-going                   Plan - 04/30/21 1530     Clinical Impression Statement Focused on manual interventions initially secondary to positive response from last session. Added indirect stretches to address fascial restrictions that are increasing cervical spine/muscle restrictions.  Reduced tension and pain noted end of session. Will continue with current POC as tolerated.    Personal Factors and Comorbidities Comorbidity 1    Comorbidities chronic neck pain/surgery    Examination-Activity Limitations Reach Overhead;Bend;Lift    Examination-Participation Restrictions Cleaning;Laundry;Yard Work;Community Activity;Driving    Stability/Clinical Decision Making Stable/Uncomplicated    Rehab Potential Good    PT Frequency 2x / week    PT Duration 4 weeks    PT Treatment/Interventions ADLs/Self Care Home Management;Aquatic Therapy;Electrical Stimulation;Iontophoresis 54m/ml Dexamethasone;Moist Heat;Traction;Ultrasound;DME Instruction;Gait training;Stair training;Functional mobility training;Therapeutic activities;Therapeutic exercise;Balance training;Neuromuscular re-education;Patient/family education;Orthotic Fit/Training;Manual techniques;Manual lymph drainage;Compression bandaging;Scar mobilization;Passive range of motion;Dry needling;Energy conservation;Splinting;Taping;Spinal Manipulations;Joint Manipulations    PT Next Visit Plan continue cervical mobility and manual for pain/ mobility, postural strength, cues to avoid breath holding    PT Home Exercise Plan 8/16 c/p ret; 8/29 desensitization; 9/1 LTR, DKC, pelvic tilts supine    Consulted and Agree with Plan of Care Patient             Patient will benefit from skilled therapeutic intervention in order to improve the following deficits and impairments:  Decreased range of motion, Increased muscle spasms, Decreased activity tolerance, Hypomobility, Improper body mechanics, Decreased mobility, Postural  dysfunction  Visit Diagnosis: Other abnormalities of gait and mobility  Other symptoms and signs involving the musculoskeletal system  Cervicalgia     Problem List There are no problems to display for this patient.  3:37 PM, 04/30/21 MJerene Pitch DPT [facility] [phone]   [place] [address]Phone: [phone]  Fax:  [phone]  Name: Regina Neal MRN: [id number] Date of Birth: 1956-07-01

## 2021-05-06 ENCOUNTER — Telehealth (HOSPITAL_COMMUNITY): Payer: Self-pay

## 2021-05-06 ENCOUNTER — Ambulatory Visit (HOSPITAL_COMMUNITY): Payer: Medicare Other

## 2021-05-06 ENCOUNTER — Encounter (HOSPITAL_COMMUNITY): Payer: PRIVATE HEALTH INSURANCE

## 2021-05-06 NOTE — Telephone Encounter (Signed)
Her dog is sick and she will not be here today. Patient l/m earlier today

## 2021-05-06 NOTE — Telephone Encounter (Signed)
Cancelled via Voicemail Pt states she left voicemail cancelling today's appointment due to ill dog needing to go to vet. Reminded pt of next appointment and wished her well.  Domenick Bookbinder PT, DPT 05/06/21, 2:48 PM

## 2021-05-12 ENCOUNTER — Other Ambulatory Visit: Payer: Self-pay

## 2021-05-12 ENCOUNTER — Encounter (HOSPITAL_COMMUNITY): Payer: Self-pay | Admitting: Physical Therapy

## 2021-05-12 ENCOUNTER — Ambulatory Visit (HOSPITAL_COMMUNITY): Payer: Medicare Other | Admitting: Physical Therapy

## 2021-05-12 DIAGNOSIS — R2689 Other abnormalities of gait and mobility: Secondary | ICD-10-CM

## 2021-05-12 DIAGNOSIS — R29898 Other symptoms and signs involving the musculoskeletal system: Secondary | ICD-10-CM

## 2021-05-12 DIAGNOSIS — M542 Cervicalgia: Secondary | ICD-10-CM

## 2021-05-12 NOTE — Therapy (Signed)
[facility] [address] [place], [place], [address] Phone: [phone]   Fax:  [phone]  Physical Therapy Treatment and Discharge Note  Patient Details  Name: Regina Neal MRN: [id number] Date of Birth: [birth year] Referring Provider (PT): Arlice Colt MD   PHYSICAL THERAPY DISCHARGE SUMMARY  Visits from Start of Care: 6  Current functional level related to goals / functional outcomes: See below  Remaining deficits: See below   Education / Equipment: See below   Patient agrees to discharge. Patient goals were not met. Patient is being discharged due to lack of progress.   Encounter Date: 05/12/2021   PT End of Session - 05/12/21 1448     Visit Number 6    Number of Visits 8    Date for PT Re-Evaluation 05/12/21    Authorization Type Primary: medicare Secondary generic commercial    PT Start Time 2229    PT Stop Time 1526    PT Time Calculation (min) 38 min    Activity Tolerance Patient tolerated treatment well;No increased pain    Behavior During Therapy WFL for tasks assessed/performed             Past Medical History:  Diagnosis Date   Asthma    exercised induced   Hypertension    Multiple sclerosis ([place])    Trigeminal neuralgia     Past Surgical History:  Procedure Laterality Date   INSERTION / PLACEMENT / REVISION NEUROSTIMULATOR Right 2015    There were no vitals filed for this visit.   Subjective Assessment - 05/12/21 1459     Subjective Reports overall  increase in symptoms after jaw work last session and pain lasted for about 4 days. States that she feels about 25% better    Currently in Pain? Yes    Pain Score 8     Pain Location Head    Pain Orientation Lower    Pain Descriptors / Indicators Aching    Pain Radiating Towards bad headache                [facility] PT Assessment - 05/12/21 0001       Assessment   Medical Diagnosis Neck pain, occipital neuralgia    Referring  Provider (PT) Arlice Colt MD    Onset Date/Surgical Date 10/15/20      Observation/Other Assessments   Focus on Therapeutic Outcomes (FOTO)  60% function - predicted 61% function, initial 59% function      AROM   Cervical Flexion 0% limited    Cervical Extension 50% limited    Cervical - Right Side Bend 50% limited    Cervical - Left Side Bend 75% limited    Cervical - Right Rotation 75% limited    Cervical - Left Rotation 50% limited      Palpation   Palpation comment continued sensitivy throughout neck/head and facial musculature                           [facility] Adult PT Treatment/Exercise - 05/12/21 0001       Neck Exercises: Supine   Other Supine Exercise self massage - 15 minutes wiht tactile      Moist Heat Therapy   Number Minutes Moist Heat 12 Minutes    Moist Heat Location Cervical                     PT Education - 05/12/21 1515  Education Details on FOTO score, on current presentation, HEP, self massage, going to professional massage therapist and traction unit. on anatomy    Person(s) Educated Patient    Methods Explanation    Comprehension Verbalized understanding              PT Short Term Goals - 05/12/21 1459       PT SHORT TERM GOAL #1   Title Patient will be independent with HEP in order to improve functional outcomes.    Baseline every few days    Time 2    Period Weeks    Status On-going    Target Date 04/28/21      PT SHORT TERM GOAL #2   Title Patient will report at least 25% improvement in symptoms for improved quality of life.    Baseline 25% better    Time 2    Period Weeks    Status Achieved    Target Date 04/28/21               PT Long Term Goals - 05/12/21 1501       PT LONG TERM GOAL #1   Title Patient will report at least 75% improvement in symptoms for improved quality of life.    Baseline 25% better    Time 4    Period Weeks    Status On-going      PT LONG TERM GOAL #2    Title Patient will improve FOTO score by at least 10 points in order to indicate improved tolerance to activity.    Time 4    Period Weeks    Status On-going      PT LONG TERM GOAL #3   Title Patient will demonstrate at least 50% improvement in cervical ROM in all restricted planes for improved ability to move head while driving.    Time 4    Period Weeks    Status On-going                   Plan - 05/12/21 1448     Clinical Impression Statement Patient present for progress note on this date. Overall minimal progress made since start of PT. Answered all questions and discussed transitioning to home program with self massage and buying home traction unit secondary to poor tolerance to external interventions at this time. Patient agreed with plan. Educated patient on self massage and how to progress self massage at home. Patient to discharge from PT to HEP secondary to lack of progress and poor tolerance to current interventions. Patient will return if needed after 2-3 months of adherence to HEP.    Personal Factors and Comorbidities Comorbidity 1    Comorbidities chronic neck pain/surgery    Examination-Activity Limitations Reach Overhead;Bend;Lift    Examination-Participation Restrictions Cleaning;Laundry;Yard Work;Community Activity;Driving    Stability/Clinical Decision Making Stable/Uncomplicated    Rehab Potential Good    PT Frequency 2x / week    PT Duration 4 weeks    PT Treatment/Interventions ADLs/Self Care Home Management;Aquatic Therapy;Electrical Stimulation;Iontophoresis 25m/ml Dexamethasone;Moist Heat;Traction;Ultrasound;DME Instruction;Gait training;Stair training;Functional mobility training;Therapeutic activities;Therapeutic exercise;Balance training;Neuromuscular re-education;Patient/family education;Orthotic Fit/Training;Manual techniques;Manual lymph drainage;Compression bandaging;Scar mobilization;Passive range of motion;Dry needling;Energy  conservation;Splinting;Taping;Spinal Manipulations;Joint Manipulations    PT Next Visit Plan continue cervical mobility and manual for pain/ mobility, postural strength, cues to avoid breath holding    PT Home Exercise Plan 8/16 c/p ret; 8/29 desensitization; 9/1 LTR, DKC, pelvic tilts supine    Consulted and Agree with Plan of Care Patient  Patient will benefit from skilled therapeutic intervention in order to improve the following deficits and impairments:  Decreased range of motion, Increased muscle spasms, Decreased activity tolerance, Hypomobility, Improper body mechanics, Decreased mobility, Postural dysfunction  Visit Diagnosis: Other abnormalities of gait and mobility  Other symptoms and signs involving the musculoskeletal system  Cervicalgia     Problem List There are no problems to display for this patient.  3:31 PM, 05/12/21 Jerene Pitch, DPT Physical Therapy with [facility]  [phone] office   [facility] [address] [place], [place], [address] Phone: [phone]   Fax:  [phone]  Name: DORISE GANGI MRN: [id number] Date of Birth: [birth year]

## 2021-05-14 ENCOUNTER — Encounter (HOSPITAL_COMMUNITY): Payer: PRIVATE HEALTH INSURANCE | Admitting: Physical Therapy

## 2021-05-19 ENCOUNTER — Encounter (HOSPITAL_COMMUNITY): Payer: PRIVATE HEALTH INSURANCE | Admitting: Physical Therapy

## 2021-05-21 ENCOUNTER — Encounter (HOSPITAL_COMMUNITY): Payer: PRIVATE HEALTH INSURANCE

## 2021-05-22 ENCOUNTER — Telehealth: Payer: Self-pay | Admitting: Neurology

## 2021-05-22 ENCOUNTER — Encounter (HOSPITAL_COMMUNITY): Payer: PRIVATE HEALTH INSURANCE

## 2021-05-22 NOTE — Telephone Encounter (Signed)
Pt is asking if Dr Epimenio Foot will manage her gabapentin (NEURONTIN) 600 MG tablet, she's like it called into [facility] [phone]

## 2021-05-25 MED ORDER — GABAPENTIN 600 MG PO TABS: 900.0000 mg | ORAL_TABLET | Freq: Four times a day (QID) | ORAL | 1 refills | Status: DC

## 2021-05-25 NOTE — Telephone Encounter (Signed)
Called pt back to offer appt today at 11:30am w/ Dr. Epimenio Foot. He just had cx. Pt unable to take this appt. Aware I will still speak with MD about med refills and call her back.

## 2021-05-25 NOTE — Telephone Encounter (Signed)
Called pt back. Dr. Epimenio Foot approved taking over refilling below prescriptions. She is only due for refill on gabapentin. Will call when she is due for rizatriptan, baclofen at a later time. I e-scribed gabapentin to [facility]now [facility]

## 2021-05-25 NOTE — Telephone Encounter (Signed)
Called pt. Scheduled earlier appt for 06/02/21 at 10am. Pt aware to check in latest 945am.

## 2021-05-25 NOTE — Telephone Encounter (Signed)
Called pt back. She thinks there must have been a misunderstanding w/ her and Dr. Epimenio Foot at last visit. She wanted to transfer care to him to follow him for her [place]. No longer following neurologist in [facility]. Needing refills on the following meds: gabapentin 600mg : directions 900mg  po QID, baclofen 20mg : directions 20 mg po TID and rizatriptan 10mg  po prn. She takes rizatriptan rarely for migraines/occipital neuralgia. Aware I will discuss w/ MD and call her back.   She completed PT that Dr. referred her to for the occipital neuralgia but it was ineffective. She scheduled appt for January and is on wait list to get in for sooner appt. She will call periodically as well to try and get in sooner.

## 2021-06-02 ENCOUNTER — Encounter: Payer: Self-pay | Admitting: Neurology

## 2021-06-02 ENCOUNTER — Other Ambulatory Visit: Payer: Self-pay

## 2021-06-02 ENCOUNTER — Ambulatory Visit (INDEPENDENT_AMBULATORY_CARE_PROVIDER_SITE_OTHER): Payer: Medicare Other | Admitting: Neurology

## 2021-06-02 VITALS — BP 130/76 | HR 71 | Ht 60.0 in | Wt 110.5 lb

## 2021-06-02 DIAGNOSIS — R9082 White matter disease, unspecified: Secondary | ICD-10-CM

## 2021-06-02 DIAGNOSIS — G501 Atypical facial pain: Secondary | ICD-10-CM | POA: Insufficient documentation

## 2021-06-02 DIAGNOSIS — M5481 Occipital neuralgia: Secondary | ICD-10-CM | POA: Diagnosis not present

## 2021-06-02 MED ORDER — LAMOTRIGINE 25 MG PO TABS: ORAL_TABLET | ORAL | 3 refills | Status: DC

## 2021-06-02 NOTE — Patient Instructions (Signed)
The pharmacy has the prescription of lamotrigine 25 mg. Please take it as follows: For 5 days take 1 pill once a day. The next 5 days,  take 1 pill twice a day. The next 5 days, take 1 pill in the morning and 2 at bedtime or evening. Then start 2 pills twice a day. Call us after you have taken 2 pills twice a day for a week  Most people tolerate lamotrigine very well. Some will get a rash. If you do get a significant rash stop the medicine immediately and let us know.

## 2021-06-02 NOTE — Progress Notes (Signed)
[facility]  PATIENT: Regina Neal DOB: 29-Jul-1956  REFERRING DOCTOR OR PCP: Delray Alt, MD, Elmer Sow, PA-C SOURCE: Patient, notes from [facility], laboratory reports, imaging reports, MRI of the brain 11/24/2019 personally reviewed.  _________________________________   HISTORICAL  CHIEF COMPLAINT:  Chief Complaint  Patient presents with   Follow-up    Rm 1,alone. Here to f/u on TN/migraine    HISTORY OF PRESENT ILLNESS:  Regina Neal is a 65 y.o.  woman with an abnormal brain MRI and concern about multiple sclerosis.  Update 06/02/2021: She is noting more pain in the right face.  The worse pain is in the foreead and second worse pain is in the V2 distribution to the upper jaw.   Se take gabapentin, baclofen and Aleve.   She was placed on naltrexone 2.5 mg by a pain management clinic but noted no benefit and did not want to try a higher dose.    She saw ophthalmology 05/27/2021.  There was some concern about the possibility of giant cell arteritis and ESR and CRP were obtained.  They were normal.  Currently, she denies difficulty with gait, strength or limb sensation.   She has altered sensation in the right face in V1 or V2.     Vision is doing well and she has frequet evaluatin by Dr. Hassell Done, neuro-ophthalmology.     She denies bladder issues.   No difficulty with cognition.  She has mild depression on days she has more pain but not when pain is absent or mild.   She has insomnia and has started [place] with some benefit.   Meds tried for neuralgia pain:  Baclofen, NSAID, Lyrica, topiramate, zonisamide, Marinol, Valproic acid, tramadol, methadone, Vimpat, Levetiracetam, steroid, Horizant, Lamotrigine, duloxetine, carbamazepine, Nuedexta, Aptoiom , amitriptyline, nortriptyline, oxcarbazepine, Belbuca, Butrans, Dilantin.      Abnormal MRI history/details Around 2000, she started to experience a burning sensation in her right eye and adjacent to  the orbit.  She had pain that was constant with superimposed intermittent worsening.  She was diagnosed with trigeminal neuralgia in 2000 and had microvascular decompression that year (Dr. Claybon Jabs) initially with benefit for a while.    She had recurence in 2008 and had a subsequent operation 2008 without improvement.   Multiple different medications were tried for the trigeminal neuralgia without much benefit.  As part of her evaluation for trigeminal neuralgia, MRIs of the brain were performed.  They showed white matter abnormalities.  She was diagnosed with MS in 2008 by Dr. Jacqulynn Cadet.  She does not believe she had a lumbar puncture around that time.  She was placed on Avonex initially (2 years) but due to low WBC switched to Copaxone in 2010 and stayed on until 2019 when she had insurance issues.   She then did Ocrevus for 3 infusions, last one around December 2020.    She started to see Dr. George Hugh who questioned the diagnosis if MS.   She did an LP 08/2020 which was normal.   Based on the appearance of the MRI and the CSF results, Dr. George Hugh felt she did not have MS.   I saw her for third opinion 03/25/2021 and also felt that she did not have multiple sclerosis.   Imaging review personally reviewed: MRI of the brain 11/24/2019 shows scattered T2/FLAIR hyperintense foci predominantly in the subcortical and deep white matter.  These have an appearance more consistent with chronic microvascular ischemic change rather than demyelination.  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  There has been a trigeminal decompression surgery on the right with evidence of right retrosigmoid craniotomy.   The trigeminal nerve appears normal..  Arterial flow in that region was normal.  MR angiogram of the brain and neck 11/24/2019 shows no stenosis  MRIs of the brain reports reports in 2012, 2014 and 2018 ([facility]) were read as being consistent with multiple sclerosis.  There is also  comment that there had been no progression between 2012 and 2014 and between 2014 and 2018  Laboratory review: CSF 11/29/2019 showed normal kappa free light chains Serum 09/19/2020 showed normal B12 and hemoglobin A1c.  Hepatitis C Ab was nonreactive.   05/27/2021: ESR 12 and CRP were both normal  REVIEW OF SYSTEMS: Constitutional: No fevers, chills, sweats, or change in appetite Eyes: No visual changes, double vision, eye pain Ear, nose and throat: No hearing loss, ear pain, nasal congestion, sore throat Cardiovascular: No chest pain, palpitations Respiratory:  No shortness of breath at rest or with exertion.   No wheezes GastrointestinaI: No nausea, vomiting, diarrhea, abdominal pain, fecal incontinence Genitourinary:  No dysuria, urinary retention or frequency.  No nocturia. Musculoskeletal:  No neck pain, back pain Integumentary: No rash, pruritus, skin lesions Neurological: as above Psychiatric: No depression at this time.  No anxiety Endocrine: No palpitations, diaphoresis, change in appetite, change in weigh or increased thirst Hematologic/Lymphatic:  No anemia, purpura, petechiae. Allergic/Immunologic: No itchy/runny eyes, nasal congestion, recent allergic reactions, rashes  ALLERGIES: Allergies  Allergen Reactions   Buprenorphine Swelling   Verapamil Rash    HOME MEDICATIONS:  Current Outpatient Medications:    amLODipine (NORVASC) 10 MG tablet, Take 10 mg by mouth daily., Disp: , Rfl:    Ascorbic Acid (VITAMIN C) 500 MG CAPS, Take 1 capsule by mouth every other day., Disp: , Rfl:    aspirin 81 MG chewable tablet, Chew by mouth daily., Disp: , Rfl:    baclofen (LIORESAL) 20 MG tablet, Take 1 tablet by mouth 3 (three) times daily., Disp: , Rfl:    Calcium Carb-Vit D-Soy Isoflav (SOY FORMULA PO), Take by mouth., Disp: , Rfl:    calcium carbonate (OSCAL) 1500 (600 Ca) MG TABS tablet, Take by mouth daily with breakfast., Disp: , Rfl:    cholecalciferol (VITAMIN D) 1000 units  tablet, Take 2,000 Units by mouth daily., Disp: , Rfl:    citalopram (CELEXA) 20 MG tablet, Take 20 mg by mouth daily., Disp: , Rfl:    eszopiclone (LUNESTA) 2 MG TABS tablet, Take 1 tablet by mouth at bedtime., Disp: , Rfl:    gabapentin (NEURONTIN) 600 MG tablet, Take 1.5 tablets (900 mg total) by mouth 4 (four) times daily., Disp: 540 tablet, Rfl: 1   lamoTRIgine (LAMICTAL) 25 MG tablet, Titrate up to 2 pills po bid as directed, Disp: 120 tablet, Rfl: 3   meloxicam (MOBIC) 15 MG tablet, Take daily for 2 weeks then as needed daily therafter, Disp: 30 tablet, Rfl: 2   Multiple Vitamin (MULTI-VITAMIN DAILY) TABS, Take 1 tablet by mouth every other day., Disp: , Rfl:    NON FORMULARY, Apply 1 patch topically as needed. Lidoderm patch, Disp: , Rfl:    Omega-3 Fatty Acids (FISH OIL OMEGA-3 PO), Take 1 tablet by mouth every other day., Disp: , Rfl:    omeprazole (PRILOSEC) 20 MG capsule, Take 20 mg by mouth daily., Disp: , Rfl:    rizatriptan (MAXALT) 10 MG tablet, Take 10 mg by mouth as needed for migraine.  May repeat in 2 hours if needed, Disp: , Rfl:    vitamin E 180 MG (400 UNITS) capsule, Take 1 capsule by mouth every other day., Disp: , Rfl:   Current Facility-Administered Medications:    dexamethasone (DECADRON) injection 2 mg, 2 mg, Other, Once, McDonald, Adam R, DPM   triamcinolone acetonide (KENALOG-40) injection 10 mg, 10 mg, Other, Once, McDonald, Stephan Minister, DPM  PAST MEDICAL HISTORY: Past Medical History:  Diagnosis Date   Asthma    exercised induced   Hypertension    Multiple sclerosis ([place])    Trigeminal neuralgia     PAST SURGICAL HISTORY: Past Surgical History:  Procedure Laterality Date   INSERTION / PLACEMENT / REVISION NEUROSTIMULATOR Right 2015    FAMILY HISTORY: Family History  Problem Relation Age of Onset   Cancer Mother    Lung cancer Father    Stroke Maternal Grandmother     SOCIAL HISTORY:  Social History   Socioeconomic History   Marital status:  Legally Separated    Spouse name: Not on file   Number of children: 2   Years of education: Not on file   Highest education level: High school graduate  Occupational History   Not on file  Tobacco Use   Smoking status: Never   Smokeless tobacco: Never  Vaping Use   Vaping Use: Never used  Substance and Sexual Activity   Alcohol use: Never   Drug use: Never   Sexual activity: Not on file  Other Topics Concern   Not on file  Social History Narrative   Lives alone   Right handed   Caffeine: 2 cups of coffee and 1 glass of tea, and half a soda a day   Social Determinants of Health   Financial Resource Strain: Not on file  Food Insecurity: Not on file  Transportation Needs: Not on file  Physical Activity: Not on file  Stress: Not on file  Social Connections: Not on file  Intimate Partner Violence: Not on file     PHYSICAL EXAM  Vitals:   06/02/21 0957  BP: 130/76  Pulse: 71  Weight: 110 lb 8 oz (50.1 kg)  Height: 5' (1.524 m)     Body mass index is 21.58 kg/m.   General: The patient is well-developed and well-nourished and in no acute distress  HEENT:  Head is [place]/AT.  Sclera are anicteric.  Funduscopic exam shows normal optic discs and retinal vessels.  Neck: No carotid bruits are noted.  The neck is nontender.  Cardiovascular: The heart has a regular rate and rhythm with a normal S1 and S2. There were no murmurs, gallops or rubs.    Skin: Extremities are without rash or  edema.  Musculoskeletal:  Back is nontender  Neurologic Exam  Mental status: The patient is alert and oriented x 3 at the time of the examination. The patient has apparent normal recent and remote memory, with an apparently normal attention span and concentration ability.   Speech is normal.  Cranial Nerves:  EOMI, pupils are equal, round, and reactive to light and accomodation.   She reported reduced temperature sensation in the right V1 and V2 distributions.  .Facial strength is normal.   Trapezius and sternocleidomastoid strength is normal. No dysarthria is noted.   No obvious hearing deficits are noted.  Motor:  Muscle bulk is normal.   Tone is normal. Strength is  5 / 5 in all 4 extremities.   Sensory: Sensory testing is intact to pinprick, soft  touch and vibration sensation in all 4 extremities.  Coordination: Cerebellar testing reveals good finger-nose-finger and heel-to-shin bilaterally.  Gait and station: Station is normal.   Gait is normal. Tandem gait is normal for age. Romberg is negative.   Reflexes: Deep tendon reflexes are symmetric and normal bilaterally.        ASSESSMENT AND PLAN  White matter abnormality on MRI of brain  Atypical facial pain  Occipital neuralgia of right side  She has atypical right facial pain.  She has a history of decompressive surgery for trigeminal neuralgia on the right.  She is currently on gabapentin and baclofen.  I will have her add lamotrigine and titrate up to 50 mg twice daily.  The dose can be further adjusted in a few weeks if she is tolerating it well I do not think that this is related to the white matter changes on the MRI. She will return to see [place] in 4 months or sooner if there are new or worsening neurologic symptoms.   Olan Kurek A. Felecia Shelling, MD, [facility] 61/04/5092, 26:71 PM Certified in Neurology, Clinical Neurophysiology, Sleep Medicine and Neuroimaging  [facility] [address] [place], [place] [address] [phone]

## 2021-06-18 ENCOUNTER — Telehealth: Payer: Self-pay | Admitting: Neurology

## 2021-06-18 NOTE — Telephone Encounter (Signed)
Called pt back. Relayed Dr. Bonnita Hollow recommendation. She will try this. She will call back if she continues to have headaches after increasing to full dose again.

## 2021-06-18 NOTE — Telephone Encounter (Signed)
Pt called, Start 10/4 gradually increasing lamoTRIgine (LAMICTAL) 25 MG tablet Today at 2 tablet am and 2 tablet pm. Have started having headaches and this one of the side effects listed on the paper from the pharmacy. Would like a call from the nurse

## 2021-06-24 NOTE — Telephone Encounter (Signed)
Called the pt back. She had backed the medication down and started to increase once more and she can already tell even on the lower dose this medication is causing difficulty with constipation. She is already on a stool softener. She stated that Dr Epimenio Foot had mentioned an alternative medication if this one didn't work. I don't see what he may have been referring to. Advised the pt that he is on vacation and that we may need to ask him when he returns what he would recommend trying next. Pt verbalized understanding and agrees with the plan.

## 2021-06-24 NOTE — Telephone Encounter (Signed)
Pt called stating the change in dosage of the medication is not helping the pain, in fact it is making her constipated. Pt is wanting to try something else, requesting a call back.

## 2021-06-30 NOTE — Telephone Encounter (Signed)
Called the patient back.  Advised that we discussed the concerns with Dr. Epimenio Foot and after reviewing the chart he has not seen where medications that we have tried have not been beneficial.  At this point he does not know of any alternative medication to try given the medications that she is already tried and failed.  Advised the patient we can refer to the [facility] in [place] to see if she is a candidate for a trigeminal nerve block and possibly benefit from that procedure more long-term.  Patient is hesitant about having to drive to [facility] and would like to think on this.  She will call us back with what she decides.

## 2021-07-08 ENCOUNTER — Telehealth: Payer: Self-pay | Admitting: Neurology

## 2021-07-08 MED ORDER — RIZATRIPTAN BENZOATE 10 MG PO TABS: 10.0000 mg | ORAL_TABLET | ORAL | 3 refills | Status: DC | PRN

## 2021-07-08 NOTE — Telephone Encounter (Signed)
Medication filled and sent to pharmacy

## 2021-07-08 NOTE — Telephone Encounter (Signed)
Pt requesting refill for rizatriptan (MAXALT) 10 MG tablet. Pharmacy [facility] - [place], [place] - [address]

## 2021-07-17 ENCOUNTER — Telehealth: Payer: Self-pay | Admitting: Neurology

## 2021-07-17 DIAGNOSIS — M542 Cervicalgia: Secondary | ICD-10-CM

## 2021-07-17 DIAGNOSIS — R9082 White matter disease, unspecified: Secondary | ICD-10-CM

## 2021-07-17 DIAGNOSIS — M5481 Occipital neuralgia: Secondary | ICD-10-CM

## 2021-07-17 DIAGNOSIS — G501 Atypical facial pain: Secondary | ICD-10-CM

## 2021-07-17 NOTE — Telephone Encounter (Signed)
Pt states she would like a call about the referral to [facility] since the last medication has not helped her.  Please call.

## 2021-07-20 ENCOUNTER — Other Ambulatory Visit: Payer: Self-pay | Admitting: *Deleted

## 2021-07-20 ENCOUNTER — Telehealth: Payer: Self-pay | Admitting: Neurology

## 2021-07-20 MED ORDER — BACLOFEN 20 MG PO TABS: 20.0000 mg | ORAL_TABLET | Freq: Three times a day (TID) | ORAL | 2 refills | Status: DC

## 2021-07-20 NOTE — Addendum Note (Signed)
Addended by: Judi Cong on: 07/20/2021 08:49 AM   Modules accepted: Orders

## 2021-07-20 NOTE — Telephone Encounter (Signed)
[facility] referral will be entered in for the pt as requested.

## 2021-07-20 NOTE — Telephone Encounter (Signed)
Called the patient to advise that the referral has been placed for the patient. There was no answer. LVM with this information

## 2021-07-20 NOTE — Telephone Encounter (Signed)
Pt is asking that her baclofen (LIORESAL) 20 MG tablet be called into [facility] .  When asked for a phone and fax# to [facility] pt states Kara Mead, RN should already have it due to calling in other medications for her.

## 2021-07-20 NOTE — Telephone Encounter (Signed)
E-scribed refill to [facility] (formally [facility]).

## 2021-07-22 NOTE — Telephone Encounter (Signed)
Referral has been sent to [facility]. Phone: [phone].

## 2021-09-10 ENCOUNTER — Ambulatory Visit: Payer: Medicare Other | Admitting: Neurology

## 2021-10-20 ENCOUNTER — Ambulatory Visit: Payer: PPO | Admitting: Neurology

## 2021-10-20 ENCOUNTER — Encounter: Payer: Self-pay | Admitting: Neurology

## 2021-10-20 VITALS — BP 133/86 | HR 94 | Ht 60.0 in | Wt 110.0 lb

## 2021-10-20 DIAGNOSIS — M5412 Radiculopathy, cervical region: Secondary | ICD-10-CM | POA: Diagnosis not present

## 2021-10-20 DIAGNOSIS — M79601 Pain in right arm: Secondary | ICD-10-CM | POA: Diagnosis not present

## 2021-10-20 DIAGNOSIS — M5481 Occipital neuralgia: Secondary | ICD-10-CM | POA: Diagnosis not present

## 2021-10-20 DIAGNOSIS — M542 Cervicalgia: Secondary | ICD-10-CM | POA: Diagnosis not present

## 2021-10-20 MED ORDER — CYCLOBENZAPRINE HCL 5 MG PO TABS: 5.0000 mg | ORAL_TABLET | Freq: Three times a day (TID) | ORAL | 5 refills | Status: DC | PRN

## 2021-10-20 NOTE — Progress Notes (Signed)
[facility]  PATIENT: Regina Neal DOB: 07-22-1956  REFERRING DOCTOR OR PCP: Delray Alt, MD, Elmer Sow, PA-C SOURCE: Patient, notes from [facility], laboratory reports, imaging reports, MRI of the brain 11/24/2019 personally reviewed.  _________________________________   HISTORICAL  CHIEF COMPLAINT:  Chief Complaint  Patient presents with   Follow-up    Pt alone, rm 2. Presents for follow up    HISTORY OF PRESENT ILLNESS:  Regina Neal is a 66 y.o.  woman with an abnormal brain MRI and concern about multiple sclerosis.  Update 10/20/2021: At the last visit, I had her try lamotrigine.  Unfortunately, that did not help she is noting more pain in the right face.  She also reports pain down her back.    Gait is doing the same.   Strength is fine.   She notes dysesthesias in her trunk and limbs.  Clothes sensation is uncomfortable.   No position is comfortable.   She saw Pain Management.   She has occipital nerve blocks lasting just hours.     She had an occipital nerve stimulator in the past but it was removed when she had MRIs done.  She dd not find a benefit..  She notes more neck and shoulder pain when she turns her neck.   The right arm hurts  Facial pain is in the V2 distribution to the upper jaw.   Se take gabapentin, baclofen, tizanidine 4 mg nightly and  Aleve.  She feels it is worse.    Currently, she denies difficulty with gait, strength or limb sensation.   She has altered sensation in the right face in V1 or V2.     Vision is doing well and she has frequet evaluatin by Dr. Hassell Done, neuro-ophthalmology.    There was some concern about the possibility of giant cell arteritis and ESR and CRP were obtained.  They were normal.  She denies bladder issues.   No difficulty with cognition.  She has mild depression on days she has more pain but not when pain is absent or mild.   She has insomnia and has started [place] with some benefit.   Meds tried  for neuralgia pain:  Baclofen, NSAID, Lyrica, topiramate, zonisamide, Marinol, Valproic acid, tramadol, methadone, Vimpat, Levetiracetam, steroid, Horizant, Lamotrigine, duloxetine, carbamazepine, Nuedexta, Aptiom , amitriptyline, nortriptyline, oxcarbazepine, Belbuca, Butrans, Dilantin.      Abnormal MRI history/details Around 2000, she started to experience a burning sensation in her right eye and adjacent to the orbit.  She had pain that was constant with superimposed intermittent worsening.  She was diagnosed with trigeminal neuralgia in 2000 and had microvascular decompression that year (Dr. Claybon Jabs) initially with benefit for a while.    She had recurence in 2008 and had a subsequent operation 2008 without improvement.   Multiple different medications were tried for the trigeminal neuralgia without much benefit.  As part of her evaluation for trigeminal neuralgia, MRIs of the brain were performed.  They showed white matter abnormalities.  She was diagnosed with MS in 2008 by Dr. Jacqulynn Cadet.  She does not believe she had a lumbar puncture around that time.  She was placed on Avonex initially (2 years) but due to low WBC switched to Copaxone in 2010 and stayed on until 2019 when she had insurance issues.   She then did Ocrevus for 3 infusions, last one around December 2020.    She started to see Dr. George Hugh who questioned the diagnosis if MS.   She did  an LP 08/2020 which was normal.   Based on the appearance of the MRI and the CSF results, Dr. George Hugh felt she did not have MS.   I saw her for third opinion 03/25/2021 and also felt that she did not have multiple sclerosis.   Imaging review personally reviewed: MRI of the brain 11/24/2019 shows scattered T2/FLAIR hyperintense foci predominantly in the subcortical and deep white matter.  These have an appearance more consistent with chronic microvascular ischemic change rather than demyelination.  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  There has been a trigeminal decompression surgery on the right with evidence of right retrosigmoid craniotomy.   The trigeminal nerve appears normal..  Arterial flow in that region was normal.  MR angiogram of the brain and neck 11/24/2019 shows no stenosis  MRIs of the brain reports reports in 2012, 2014 and 2018 ([facility]) were read as being consistent with multiple sclerosis.  There is also comment that there had been no progression between 2012 and 2014 and between 2014 and 2018  Laboratory review: CSF 11/29/2019 showed normal kappa free light chains Serum 09/19/2020 showed normal B12 and hemoglobin A1c.  Hepatitis C Ab was nonreactive.   05/27/2021: ESR 12 and CRP were both normal  REVIEW OF SYSTEMS: Constitutional: No fevers, chills, sweats, or change in appetite Eyes: No visual changes, double vision, eye pain Ear, nose and throat: No hearing loss, ear pain, nasal congestion, sore throat Cardiovascular: No chest pain, palpitations Respiratory:  No shortness of breath at rest or with exertion.   No wheezes GastrointestinaI: No nausea, vomiting, diarrhea, abdominal pain, fecal incontinence Genitourinary:  No dysuria, urinary retention or frequency.  No nocturia. Musculoskeletal:  No neck pain, back pain Integumentary: No rash, pruritus, skin lesions Neurological: as above Psychiatric: No depression at this time.  No anxiety Endocrine: No palpitations, diaphoresis, change in appetite, change in weigh or increased thirst Hematologic/Lymphatic:  No anemia, purpura, petechiae. Allergic/Immunologic: No itchy/runny eyes, nasal congestion, recent allergic reactions, rashes  ALLERGIES: Allergies  Allergen Reactions   Buprenorphine Swelling   Verapamil Rash    HOME MEDICATIONS:  Current Outpatient Medications:    amLODipine (NORVASC) 10 MG tablet, Take 10 mg by mouth daily., Disp: , Rfl:    Ascorbic Acid (VITAMIN C) 500 MG CAPS, Take 2 capsules by mouth every other day.,  Disp: , Rfl:    aspirin 81 MG chewable tablet, Chew by mouth daily., Disp: , Rfl:    baclofen (LIORESAL) 20 MG tablet, Take 1 tablet (20 mg total) by mouth 3 (three) times daily., Disp: 270 each, Rfl: 2   calcium carbonate (OSCAL) 1500 (600 Ca) MG TABS tablet, Take by mouth daily with breakfast., Disp: , Rfl:    cholecalciferol (VITAMIN D) 1000 units tablet, Take 2,000 Units by mouth daily., Disp: , Rfl:    citalopram (CELEXA) 20 MG tablet, Take 20 mg by mouth daily., Disp: , Rfl:    cyclobenzaprine (FLEXERIL) 5 MG tablet, Take 1 tablet (5 mg total) by mouth every 8 (eight) hours as needed for muscle spasms., Disp: 90 tablet, Rfl: 5   eszopiclone (LUNESTA) 2 MG TABS tablet, Take 1 tablet by mouth at bedtime., Disp: , Rfl:    gabapentin (NEURONTIN) 600 MG tablet, Take 1.5 tablets (900 mg total) by mouth 4 (four) times daily., Disp: 540 tablet, Rfl: 1   Multiple Vitamin (MULTI-VITAMIN DAILY) TABS, Take 1 tablet by mouth every other day., Disp: , Rfl:    NON FORMULARY, Apply  1 patch topically as needed. Lidoderm patch, Disp: , Rfl:    Omega-3 Fatty Acids (FISH OIL OMEGA-3 PO), Take 1 tablet by mouth every other day., Disp: , Rfl:    omeprazole (PRILOSEC) 20 MG capsule, Take 20 mg by mouth daily., Disp: , Rfl:    rizatriptan (MAXALT) 10 MG tablet, Take 1 tablet (10 mg total) by mouth as needed for migraine. May repeat in 2 hours if needed, Disp: 10 tablet, Rfl: 3   rosuvastatin (CRESTOR) 20 MG tablet, Take by mouth., Disp: , Rfl:    tiZANidine (ZANAFLEX) 4 MG tablet, Take 4 mg by mouth daily., Disp: , Rfl:    vitamin E 180 MG (400 UNITS) capsule, Take 1 capsule by mouth every other day., Disp: , Rfl:   Current Facility-Administered Medications:    dexamethasone (DECADRON) injection 2 mg, 2 mg, Other, Once, McDonald, Adam R, DPM   triamcinolone acetonide (KENALOG-40) injection 10 mg, 10 mg, Other, Once, McDonald, Stephan Minister, DPM  PAST MEDICAL HISTORY: Past Medical History:  Diagnosis Date   Asthma     exercised induced   Hypertension    Multiple sclerosis ([place])    Trigeminal neuralgia     PAST SURGICAL HISTORY: Past Surgical History:  Procedure Laterality Date   INSERTION / PLACEMENT / REVISION NEUROSTIMULATOR Right 2015    FAMILY HISTORY: Family History  Problem Relation Age of Onset   Cancer Mother    Lung cancer Father    Stroke Maternal Grandmother     SOCIAL HISTORY:  Social History   Socioeconomic History   Marital status: Legally Separated    Spouse name: Not on file   Number of children: 2   Years of education: Not on file   Highest education level: High school graduate  Occupational History   Not on file  Tobacco Use   Smoking status: Never   Smokeless tobacco: Never  Vaping Use   Vaping Use: Never used  Substance and Sexual Activity   Alcohol use: Never   Drug use: Never   Sexual activity: Not on file  Other Topics Concern   Not on file  Social History Narrative   Lives alone   Right handed   Caffeine: 2 cups of coffee and 1 glass of tea, and half a soda a day   Social Determinants of Health   Financial Resource Strain: Not on file  Food Insecurity: Not on file  Transportation Needs: Not on file  Physical Activity: Not on file  Stress: Not on file  Social Connections: Not on file  Intimate Partner Violence: Not on file     PHYSICAL EXAM  Vitals:   10/20/21 1515  BP: 133/86  Pulse: 94  Weight: 110 lb (49.9 kg)  Height: 5' (1.524 m)     Body mass index is 21.48 kg/m.   General: The patient is well-developed and well-nourished and in no acute distress  HEENT:  Head is [place]/AT.  Sclera are anicteric.  Funduscopic exam shows normal optic discs and retinal vessels.  Neck: The neck is tender at the right occiput.  Range of motion is mildly reduced in the neck.  Skin: Extremities are without rash or  edema.  Musculoskeletal:  Back is nontender  Neurologic Exam  Mental status: The patient is alert and oriented x 3 at the time  of the examination. The patient has apparent normal recent and remote memory, with an apparently normal attention span and concentration ability.   Speech is normal.  Cranial Nerves:  EOMI,   She reported reduced temperature sensation in the right V1 and V2 distributions.  .Facial strength is normal.  Trapezius and sternocleidomastoid strength is normal. No dysarthria is noted.   No obvious hearing deficits are noted.  Motor:  Muscle bulk is normal.   Tone is normal. Strength is  5 / 5 in all 4 extremities.   Sensory: Sensory testing is intact to pinprick, soft touch and vibration sensation in all 4 extremities.  Coordination: Cerebellar testing reveals good finger-nose-finger and heel-to-shin bilaterally.  Gait and station: Station is normal.   Gait is normal. Tandem gait is normal for age. Romberg is negative.   Reflexes: Deep tendon reflexes are symmetric and normal bilaterally.        ASSESSMENT AND PLAN  Occipital neuralgia of right side - Plan: MR CERVICAL SPINE W WO CONTRAST  Right arm pain - Plan: MR CERVICAL SPINE W WO CONTRAST  Cervical radiculopathy - Plan: MR CERVICAL SPINE W WO CONTRAST  Neck pain  Continue gabapentin and baclofen for occipital neuralgia/trigeminal neuralgia.  She will stop tizanidine and I will add Flexeril.  Check MRI of the cervical spine to determine if there is severe facet hypertrophy or other abnormality in the upper cervical spine contributing to her pain.  Depending on results consider referral back to pain management for injection or radiofrequency.   Although she was diagnosed with MS in the past, I do not think that she has this disease.  The white matter changes are stable and unlikely to be the source of her pain She will return to see [place] in 4 months or sooner if there are new or worsening neurologic symptoms.   Mar[place]ne Ridge A. Felecia Shelling, MD, [facility]ord Shave 9/75/3005, 1:10 PM Certified in Neurology, Clinical Neurophysiology, Sleep Medicine and  Neuroimaging  [facility] [address] [place], [place] [address] [phone]

## 2021-10-21 ENCOUNTER — Telehealth: Payer: Self-pay | Admitting: Neurology

## 2021-10-21 NOTE — Telephone Encounter (Signed)
HTA order sent to [facility], [facility] they will reach out to the patient to schedule.  

## 2021-10-26 ENCOUNTER — Ambulatory Visit: Payer: Medicare Other | Admitting: Neurology

## 2021-11-01 ENCOUNTER — Other Ambulatory Visit: Payer: Self-pay

## 2021-11-01 ENCOUNTER — Ambulatory Visit
Admission: RE | Admit: 2021-11-01 | Discharge: 2021-11-01 | Disposition: A | Discharge status: Home / Self Care | Payer: Medicare Other | Source: Ambulatory Visit | Attending: Neurology | Admitting: Neurology

## 2021-11-01 DIAGNOSIS — M5481 Occipital neuralgia: Secondary | ICD-10-CM

## 2021-11-01 DIAGNOSIS — M5412 Radiculopathy, cervical region: Secondary | ICD-10-CM

## 2021-11-01 DIAGNOSIS — M79601 Pain in right arm: Secondary | ICD-10-CM

## 2021-11-01 MED ORDER — GADOBENATE DIMEGLUMINE 529 MG/ML IV SOLN
10.0000 mL | Freq: Once | INTRAVENOUS | Status: AC | PRN
  Administered 2021-11-01: 10 mL via INTRAVENOUS

## 2021-11-02 ENCOUNTER — Ambulatory Visit: Payer: Medicare Other | Admitting: Neurology

## 2021-11-11 ENCOUNTER — Encounter: Payer: Self-pay | Admitting: Neurology

## 2021-11-11 ENCOUNTER — Other Ambulatory Visit: Payer: Self-pay | Admitting: *Deleted

## 2021-11-11 DIAGNOSIS — M5412 Radiculopathy, cervical region: Secondary | ICD-10-CM

## 2021-11-17 ENCOUNTER — Ambulatory Visit
Admission: RE | Admit: 2021-11-17 | Discharge: 2021-11-17 | Disposition: A | Discharge status: Home / Self Care | Payer: PPO | Source: Ambulatory Visit | Attending: Neurology | Admitting: Neurology

## 2021-11-17 ENCOUNTER — Other Ambulatory Visit: Payer: Self-pay

## 2021-11-17 DIAGNOSIS — M5412 Radiculopathy, cervical region: Secondary | ICD-10-CM

## 2021-11-17 MED ORDER — TRIAMCINOLONE ACETONIDE 40 MG/ML IJ SUSP (RADIOLOGY)
60.0000 mg | Freq: Once | INTRAMUSCULAR | Status: AC
  Administered 2021-11-17: 60 mg via EPIDURAL

## 2021-11-17 MED ORDER — IOPAMIDOL (ISOVUE-M 300) INJECTION 61%
1.0000 mL | Freq: Once | INTRAMUSCULAR | Status: AC | PRN
  Administered 2021-11-17: 1 mL via EPIDURAL

## 2021-11-17 NOTE — Discharge Instructions (Signed)

## 2021-11-23 ENCOUNTER — Other Ambulatory Visit: Payer: Self-pay

## 2021-11-23 MED ORDER — RIZATRIPTAN BENZOATE 10 MG PO TABS
10.0000 mg | ORAL_TABLET | ORAL | 6 refills | Status: AC | PRN
Start: 2021-11-23 — End: ?

## 2022-01-06 ENCOUNTER — Other Ambulatory Visit: Payer: Self-pay | Admitting: *Deleted

## 2022-01-06 MED ORDER — GABAPENTIN 600 MG PO TABS
900.0000 mg | ORAL_TABLET | Freq: Four times a day (QID) | ORAL | 1 refills | Status: DC
Start: 2022-01-06 — End: 2022-08-16

## 2022-02-12 ENCOUNTER — Encounter: Payer: Self-pay | Admitting: Neurology

## 2022-02-15 ENCOUNTER — Other Ambulatory Visit: Payer: Self-pay | Admitting: *Deleted

## 2022-02-15 DIAGNOSIS — M5412 Radiculopathy, cervical region: Secondary | ICD-10-CM

## 2022-02-18 ENCOUNTER — Ambulatory Visit
Admission: RE | Admit: 2022-02-18 | Discharge: 2022-02-18 | Disposition: A | Discharge status: Home / Self Care | Payer: PPO | Source: Ambulatory Visit | Attending: Neurology | Admitting: Neurology

## 2022-02-18 DIAGNOSIS — M5412 Radiculopathy, cervical region: Secondary | ICD-10-CM

## 2022-02-18 MED ORDER — TRIAMCINOLONE ACETONIDE 40 MG/ML IJ SUSP (RADIOLOGY)
60.0000 mg | Freq: Once | INTRAMUSCULAR | Status: AC
  Administered 2022-02-18: 60 mg via EPIDURAL

## 2022-02-18 MED ORDER — IOPAMIDOL (ISOVUE-M 300) INJECTION 61%
1.0000 mL | Freq: Once | INTRAMUSCULAR | Status: AC
  Administered 2022-02-18: 1 mL via EPIDURAL

## 2022-02-18 NOTE — Discharge Instructions (Signed)

## 2022-04-12 ENCOUNTER — Other Ambulatory Visit: Payer: Self-pay

## 2022-04-12 MED ORDER — BACLOFEN 20 MG PO TABS: 20.0000 mg | ORAL_TABLET | Freq: Three times a day (TID) | ORAL | 2 refills | Status: DC

## 2022-04-21 ENCOUNTER — Ambulatory Visit: Payer: PPO | Admitting: Neurology

## 2022-04-21 ENCOUNTER — Encounter: Payer: Self-pay | Admitting: Neurology

## 2022-04-21 VITALS — BP 108/78 | HR 99 | Ht 60.0 in | Wt 114.0 lb

## 2022-04-21 DIAGNOSIS — G501 Atypical facial pain: Secondary | ICD-10-CM | POA: Diagnosis not present

## 2022-04-21 DIAGNOSIS — R9082 White matter disease, unspecified: Secondary | ICD-10-CM

## 2022-04-21 DIAGNOSIS — M5481 Occipital neuralgia: Secondary | ICD-10-CM

## 2022-04-21 DIAGNOSIS — M47812 Spondylosis without myelopathy or radiculopathy, cervical region: Secondary | ICD-10-CM | POA: Diagnosis not present

## 2022-04-21 NOTE — Progress Notes (Signed)
[facility]  PATIENT: Regina Neal DOB: [birth year]  REFERRING DOCTOR OR PCP: Delray Alt, MD, Elmer Sow, PA-C SOURCE: Patient, notes from [facility] laboratory reports, imaging reports, MRI of the brain 11/24/2019 personally reviewed.  _________________________________   HISTORICAL  CHIEF COMPLAINT:  Chief Complaint  Patient presents with   Follow-up    Rm 14, alone. Here for 6 month f/u for occipital neuralgia. Per pt last epidural injec did not work.     HISTORY OF PRESENT ILLNESS:  Regina Neal is a 66 y.o.  woman with an abnormal brain MRI and concern about multiple sclerosis.  Update 04/21/2022: The first cervical epidural steroid injection helped her headaches for several days but the second one had not helped.   Pain is right >>left.   Pain is mostly occipital and only sometimes radiates further up.   Wearing glasses (the weght) worsens the pain.     She is still on gabapentin and baclofen without much help.   Lamotrigine and tizanidine had not helped.   She was sleeping poorly due to the pain.   Clonazepam had been restarted and she is doing better.    The one morning she had an episode of lightheadedness and she sat down.  She then had reduced responsiveness for several minutes.  Her husband was calling her name but she has no memory and did not respond.   No GTC, posturing, incontinence.   Her eyes were open she just did not respond.     In the past, she saw Pain Management.   She has occipital nerve blocks lasting just hours.     She had an occipital nerve stimulator in the past but it was removed when she had MRIs done.  She dd not find a benefit..   We had ordered ESI and the forst one helped a couple weeks and second one had no benefit.    She notes more neck and shoulder pain when she turns her neck.   The right arm hurts  Facial pain is in the V2 distribution to the upper jaw.   Se take gabapentin, baclofen, tizanidine 4 mg nightly  and  Aleve.  She feels it is worse.    Currently, she denies difficulty with gait, strength or limb sensation.   She has altered sensation in the right face in V1 or V2.     Vision is doing well and she has frequet evaluatin by Dr. Hassell Done, neuro-ophthalmology.    There was some concern about the possibility of giant cell arteritis and ESR and CRP were obtained.  They were normal.  She denies bladder issues.   No difficulty with cognition.  She has mild depression on days she has more pain but not when pain is absent or mild.   She has insomnia and has started [place] with some benefit.   Meds tried for neuralgia pain:  Baclofen, NSAID, Lyrica, topiramate, zonisamide, Marinol, Valproic acid, tramadol, methadone, Vimpat, Levetiracetam, steroid, Horizant, Lamotrigine, duloxetine, carbamazepine, Nuedexta, Aptiom , amitriptyline, nortriptyline, oxcarbazepine, Belbuca, Butrans, Dilantin.      Abnormal MRI history/details Around 2000, she started to experience a burning sensation in her right eye and adjacent to the orbit.  She had pain that was constant with superimposed intermittent worsening.  She was diagnosed with trigeminal neuralgia in 2000 and had microvascular decompression that year (Dr. Claybon Jabs) initially with benefit for a while.    She had recurence in 2008 and had a subsequent operation 2008 without improvement.  Multiple different medications were tried for the trigeminal neuralgia without much benefit.  As part of her evaluation for trigeminal neuralgia, MRIs of the brain were performed.  They showed white matter abnormalities.  She was diagnosed with MS in 2008 by Dr. Jacqulynn Cadet.  She does not believe she had a lumbar puncture around that time.  She was placed on Avonex initially (2 years) but due to low WBC switched to Copaxone in 2010 and stayed on until 2019 when she had insurance issues.   She then did Ocrevus for 3 infusions, last one around December 2020.    She started to see Dr. George Hugh  who questioned the diagnosis if MS.   She did an LP 08/2020 which was normal.   Based on the appearance of the MRI and the CSF results, Dr. George Hugh felt she did not have MS.   I saw her for third opinion 03/25/2021 and also felt that she did not have multiple sclerosis.   Imaging review personally reviewed: MRI of the brain 11/24/2019 shows scattered T2/FLAIR hyperintense foci predominantly in the subcortical and deep white matter.  These have an appearance more consistent with chronic microvascular ischemic change rather than demyelination.  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  There has been a trigeminal decompression surgery on the right with evidence of right retrosigmoid craniotomy.   The trigeminal nerve appears normal..  Arterial flow in that region was normal.  MR angiogram of the brain and neck 11/24/2019 shows no stenosis  MRIs of the brain reports reports in 2012, 2014 and 2018 ([facility]) were read as being consistent with multiple sclerosis.  There is also comment that there had been no progression between 2012 and 2014 and between 2014 and 2018  MRI cervical spine 11/01/2021 shows left facet fusion at [facility] and right facet hypertrophy at C3C4.   Marland Kitchen  Minimal retroplisthesis at [facility] with borderline spinal stenosis and moderate foraminal narrowing at [place] and C6C7.     Laboratory review: CSF 11/29/2019 showed normal kappa free light chains Serum 09/19/2020 showed normal B12 and hemoglobin A1c.  Hepatitis C Ab was nonreactive.   05/27/2021: ESR 12 and CRP were both normal  REVIEW OF SYSTEMS: Constitutional: No fevers, chills, sweats, or change in appetite Eyes: No visual changes, Neal vision, eye pain Ear, nose and throat: No hearing loss, ear pain, nasal congestion, sore throat Cardiovascular: No chest pain, palpitations Respiratory:  No shortness of breath at rest or with exertion.   No wheezes GastrointestinaI: No nausea, vomiting, diarrhea, abdominal  pain, fecal incontinence Genitourinary:  No dysuria, urinary retention or frequency.  No nocturia. Musculoskeletal:  No neck pain, back pain Integumentary: No rash, pruritus, skin lesions Neurological: as above Psychiatric: No depression at this time.  No anxiety Endocrine: No palpitations, diaphoresis, change in appetite, change in weigh or increased thirst Hematologic/Lymphatic:  No anemia, purpura, petechiae. Allergic/Immunologic: No itchy/runny eyes, nasal congestion, recent allergic reactions, rashes  ALLERGIES: Allergies  Allergen Reactions   Buprenorphine Swelling   Verapamil Rash    HOME MEDICATIONS:  Current Outpatient Medications:    amLODipine (NORVASC) 10 MG tablet, Take 10 mg by mouth daily., Disp: , Rfl:    Ascorbic Acid (VITAMIN C) 500 MG CAPS, Take 2 capsules by mouth every other day., Disp: , Rfl:    aspirin 81 MG chewable tablet, Chew by mouth daily., Disp: , Rfl:    baclofen (LIORESAL) 20 MG tablet, Take 1 tablet (20 mg total) by mouth 3 (three)  times daily., Disp: 270 each, Rfl: 2   calcium carbonate (OSCAL) 1500 (600 Ca) MG TABS tablet, Take by mouth daily with breakfast., Disp: , Rfl:    cholecalciferol (VITAMIN D) 1000 units tablet, Take 2,000 Units by mouth daily., Disp: , Rfl:    clonazePAM (KLONOPIN) 0.5 MG tablet, Take 0.5 mg by mouth at bedtime as needed., Disp: , Rfl:    cyclobenzaprine (FLEXERIL) 5 MG tablet, Take 1 tablet (5 mg total) by mouth every 8 (eight) hours as needed for muscle spasms., Disp: 90 tablet, Rfl: 5   DULoxetine (CYMBALTA) 20 MG capsule, Take 20 mg by mouth every morning., Disp: , Rfl:    gabapentin (NEURONTIN) 600 MG tablet, Take 1.5 tablets (900 mg total) by mouth 4 (four) times daily., Disp: 540 tablet, Rfl: 1   Multiple Vitamin (MULTI-VITAMIN DAILY) TABS, Take 1 tablet by mouth every other day., Disp: , Rfl:    NON FORMULARY, Apply 1 patch topically as needed. Lidoderm patch, Disp: , Rfl:    Omega-3 Fatty Acids (FISH OIL OMEGA-3  PO), Take 1 tablet by mouth every other day., Disp: , Rfl:    omeprazole (PRILOSEC) 20 MG capsule, Take 20 mg by mouth daily., Disp: , Rfl:    rizatriptan (MAXALT) 10 MG tablet, Take 1 tablet (10 mg total) by mouth as needed for migraine. May repeat in 2 hours if needed, Disp: 10 tablet, Rfl: 6   rosuvastatin (CRESTOR) 20 MG tablet, Take by mouth., Disp: , Rfl:    vitamin E 180 MG (400 UNITS) capsule, Take 1 capsule by mouth every other day., Disp: , Rfl:   Current Facility-Administered Medications:    triamcinolone acetonide (KENALOG-40) injection 10 mg, 10 mg, Other, Once, McDonald, Stephan Minister, DPM  PAST MEDICAL HISTORY: Past Medical History:  Diagnosis Date   Asthma    exercised induced   Hypertension    Multiple sclerosis ([place])    Trigeminal neuralgia     PAST SURGICAL HISTORY: Past Surgical History:  Procedure Laterality Date   INSERTION / PLACEMENT / REVISION NEUROSTIMULATOR Right 2015    FAMILY HISTORY: Family History  Problem Relation Age of Onset   Cancer Mother    Lung cancer Father    Stroke Maternal Grandmother     SOCIAL HISTORY:  Social History   Socioeconomic History   Marital status: Legally Separated    Spouse name: Not on file   Number of children: 2   Years of education: Not on file   Highest education level: High school graduate  Occupational History   Not on file  Tobacco Use   Smoking status: Never   Smokeless tobacco: Never  Vaping Use   Vaping Use: Never used  Substance and Sexual Activity   Alcohol use: Never   Drug use: Never   Sexual activity: Not on file  Other Topics Concern   Not on file  Social History Narrative   Lives alone   Right handed   Caffeine: 2 cups of coffee and 1 glass of tea, and half a soda a day   Social Determinants of Health   Financial Resource Strain: Not on file  Food Insecurity: Not on file  Transportation Needs: Not on file  Physical Activity: Not on file  Stress: Not on file  Social Connections: Not  on file  Intimate Partner Violence: Not on file     PHYSICAL EXAM  Vitals:   04/21/22 1311  BP: 108/78  Pulse: 99  Weight: 114 lb (51.7 kg)  Height:  5' (1.524 m)     Body mass index is 22.26 kg/m.   General: The patient is well-developed and well-nourished and in no acute distress  HEENT:  Head is [place]/AT.  Sclera are anicteric.  Funduscopic exam shows normal optic discs and retinal vessels.  Neck: She has tenderness at the right occiput..  Range of motion is mildly reduced in the neck.  Skin: Extremities are without rash or  edema.  Musculoskeletal:  Back is nontender  Neurologic Exam  Mental status: The patient is alert and oriented x 3 at the time of the examination. The patient has apparent normal recent and remote memory, with an apparently normal attention span and concentration ability.   Speech is normal.  Cranial Nerves:  EOMI,   She reported reduced temperature sensation in the right V1 and V2 distributions.  .Facial strength is normal.  Trapezius and sternocleidomastoid strength is normal. No dysarthria is noted.   No obvious hearing deficits are noted.  Motor:  Muscle bulk is normal.   Tone is normal. Strength is  5 / 5 in all 4 extremities.   Sensory: Sensory testing is intact to pinprick, soft touch and vibration sensation in all 4 extremities.  Coordination: Cerebellar testing reveals good finger-nose-finger and heel-to-shin bilaterally.  Gait and station: Station is normal.  Gait and tandem gait are normal for age.. Romberg is negative.   Reflexes: Deep tendon reflexes are symmetric and normal bilaterally.        ASSESSMENT AND PLAN  Occipital neuralgia of right side - Plan: Ambulatory referral to Pain Clinic  Atypical facial pain  White matter abnormality on MRI of brain  Facet hypertrophy of cervical region - Plan: Ambulatory referral to Pain Clinic   Continue gabapentin, Flexeril, duloxetine and baclofen for occipital neuralgia/trigeminal  neuralgia.  She has not responded well to medications for the right occipital neuralgia.  We discussed referring back to the pain Institute in [place] for consideration of right occipital nerve radiofrequency ablation.  Of note, the MRI of the cervical spine 11/01/2021 showed left facet fusion at C2-C3 (she does not have left occipital pain) and right facet hypertrophy at C3-C4 which could also be a source of pain. If she has any more episodes of transient alteration of awareness, we will check an EEG.  The description makes seizure unlikely.   She will return to see [place] in 6 months or sooner if there are new or worsening neurologic symptoms.   Aften Lipsey A. Felecia Shelling, MD, Gifford Shave 3/72/9021, 1:15 PM Certified in Neurology, Clinical Neurophysiology, Sleep Medicine and Neuroimaging  [facility] [address], [place], [place] [address] [phone]

## 2022-04-22 ENCOUNTER — Telehealth: Payer: Self-pay | Admitting: Neurology

## 2022-04-22 NOTE — Telephone Encounter (Signed)
[facility] Dr. Tommy [facility] [phone]

## 2022-05-27 ENCOUNTER — Ambulatory Visit: Payer: PPO | Admitting: Podiatry

## 2022-05-27 ENCOUNTER — Ambulatory Visit (INDEPENDENT_AMBULATORY_CARE_PROVIDER_SITE_OTHER): Payer: PPO

## 2022-05-27 DIAGNOSIS — M2011 Hallux valgus (acquired), right foot: Secondary | ICD-10-CM | POA: Diagnosis not present

## 2022-05-27 DIAGNOSIS — M21611 Bunion of right foot: Secondary | ICD-10-CM

## 2022-05-27 DIAGNOSIS — M7741 Metatarsalgia, right foot: Secondary | ICD-10-CM

## 2022-05-27 DIAGNOSIS — M2041 Other hammer toe(s) (acquired), right foot: Secondary | ICD-10-CM | POA: Diagnosis not present

## 2022-05-27 DIAGNOSIS — Q66219 Congenital metatarsus primus varus, unspecified foot: Secondary | ICD-10-CM | POA: Diagnosis not present

## 2022-05-27 DIAGNOSIS — M84374A Stress fracture, right foot, initial encounter for fracture: Secondary | ICD-10-CM | POA: Diagnosis not present

## 2022-05-27 DIAGNOSIS — M7751 Other enthesopathy of right foot: Secondary | ICD-10-CM

## 2022-05-29 NOTE — Progress Notes (Signed)
  Subjective:  Patient ID: Regina Neal, female    DOB: [birth year],  MRN: [id number]  Chief Complaint  Patient presents with   Foot Pain    bunion pain right foot/2nd toe stress fracture    66 y.o. female returns with the above complaint. History confirmed with patient. She is doing better the pain from the fracture is no longer present. She is interested in having bunion surgery at this point   Objective:  Physical Exam: warm, good capillary refill, no trophic changes or ulcerative lesions, normal DP and PT pulses and normal sensory exam.  Right Foot: significant hallux valgus deformity with large hypermobility at the 1st TMTJ and medial bunion deformity. 2nd hammertoe reducible  Radiographs: X-ray of the right foot: well healed 2nd metatarsal stress fracture, large hallux vaglus deformity with increased 1st IM angle and frontal plane valgus of sesamoids Assessment:   1. Stress fracture of metatarsal bone of right foot, initial encounter   2. Hallux valgus with bunions, right   3. Metatarsus primus varus   4. Hammertoe of right foot   5. Capsulitis of metatarsophalangeal (MTP) joint of right foot   6. Metatarsalgia of right foot      Plan:  Patient was evaluated and treated and all questions answered.  Discussed the etiology and treatment including surgical and non surgical treatment for painful bunions and hammertoes. She has exhausted all non surgical treatment prior to this visit including shoe gear changes and padding. She desires surgical intervention. We discussed all risks including but not limited to: pain, swelling, infection, scar, numbness which may be temporary or permanent, chronic pain, stiffness, nerve pain or damage, wound healing problems, bone healing problems including delayed or non-union and recurrence. Specifically we discussed the following procedures: right foot Lapidus bunionectomy, possible Akin osteotomy, 2nd hammertoe and Weil osteotomy, bone graft from  heel. Informed consent was signed today. Surgery will be scheduled at a mutually agreeable date. Information regarding this will be forwarded to our surgery scheduler.   Surgical plan:  Procedure: -Right foot Lapidus bunionectomy, possible Akin osteotomy, 2nd hammertoe and Weil osteotomy, bone graft from heel  Location: -[facility]  Anesthesia plan: -IV sedation with regional block  Postoperative pain plan: - Tylenol 1000 mg every 6 hours, ibuprofen 600 mg every 6 hours, gabapentin 300 mg every 8 hours x5 days, oxycodone 5 mg 1-2 tabs every 6 hours only as needed  DVT prophylaxis: -none required  WB Restrictions / DME needs: -NWB in CAM boot post op which she has with rolling knee scooter  No follow-ups on file.

## 2022-07-08 LAB — HM MAMMOGRAPHY

## 2022-07-09 ENCOUNTER — Other Ambulatory Visit: Payer: Self-pay | Admitting: Obstetrics and Gynecology

## 2022-07-09 DIAGNOSIS — E2839 Other primary ovarian failure: Secondary | ICD-10-CM

## 2022-07-27 ENCOUNTER — Ambulatory Visit (INDEPENDENT_AMBULATORY_CARE_PROVIDER_SITE_OTHER): Payer: PPO | Admitting: Family Medicine

## 2022-07-27 VITALS — BP 137/79 | Ht 60.0 in | Wt 115.8 lb

## 2022-07-27 DIAGNOSIS — Z79899 Other long term (current) drug therapy: Secondary | ICD-10-CM | POA: Diagnosis not present

## 2022-07-27 DIAGNOSIS — M5481 Occipital neuralgia: Secondary | ICD-10-CM

## 2022-07-27 DIAGNOSIS — E785 Hyperlipidemia, unspecified: Secondary | ICD-10-CM | POA: Diagnosis not present

## 2022-07-27 DIAGNOSIS — D7589 Other specified diseases of blood and blood-forming organs: Secondary | ICD-10-CM | POA: Diagnosis not present

## 2022-07-27 DIAGNOSIS — G4700 Insomnia, unspecified: Secondary | ICD-10-CM

## 2022-07-27 DIAGNOSIS — G500 Trigeminal neuralgia: Secondary | ICD-10-CM

## 2022-07-27 DIAGNOSIS — I639 Cerebral infarction, unspecified: Secondary | ICD-10-CM | POA: Insufficient documentation

## 2022-07-27 DIAGNOSIS — I10 Essential (primary) hypertension: Secondary | ICD-10-CM | POA: Diagnosis not present

## 2022-07-27 NOTE — Patient Instructions (Signed)
Send a message in [place] regarding refills.  Labs at your convenience.  Continue your medications.  Follow up in 6 months - 1 year.  Take care  Dr. Adriana Simas

## 2022-07-28 DIAGNOSIS — K219 Gastro-esophageal reflux disease without esophagitis: Secondary | ICD-10-CM | POA: Insufficient documentation

## 2022-07-28 DIAGNOSIS — E785 Hyperlipidemia, unspecified: Secondary | ICD-10-CM | POA: Insufficient documentation

## 2022-07-28 DIAGNOSIS — M8580 Other specified disorders of bone density and structure, unspecified site: Secondary | ICD-10-CM | POA: Insufficient documentation

## 2022-07-28 DIAGNOSIS — D7589 Other specified diseases of blood and blood-forming organs: Secondary | ICD-10-CM | POA: Insufficient documentation

## 2022-07-28 DIAGNOSIS — G43909 Migraine, unspecified, not intractable, without status migrainosus: Secondary | ICD-10-CM | POA: Insufficient documentation

## 2022-07-28 DIAGNOSIS — G4700 Insomnia, unspecified: Secondary | ICD-10-CM | POA: Insufficient documentation

## 2022-07-28 NOTE — Progress Notes (Signed)
Subjective:  Patient ID: Regina Neal, female    DOB: 06-11-1956  Age: 66 y.o. MRN: [id number]  CC: Chief Complaint  Patient presents with   Establish Care    Patient states she has issues with trigemina neuralgia that started in last year but no problems or concerns   HPI:  66 year old female with the below mentioned PMH presents to establish care.   Main issue is chronic pain (trigeminal neuralgia, occipital neuralgia). Followed by neurology and pain management. Currently on gabapentin, flexeril, cymbalta, baclofen.   Lipids with fair control, LDL 85. Goal <70 given prior cerebellar infarcts. On Crestor 20 mg daily. Needs labs today.  Hypertension is well controlled on Norvasc.  On Klonopin QHS for insomnia. Has failed other options previously per notes from [facility].   Patient Active Problem List   Diagnosis Date Noted   Migraine 07/28/2022   Hyperlipidemia 07/28/2022   Insomnia 07/28/2022   Osteopenia 07/28/2022   GERD (gastroesophageal reflux disease) 07/28/2022   Macrocytosis 07/28/2022   Trigeminal neuralgia 07/27/2022   Cerebellar infarct ([place]) 07/27/2022   Facet hypertrophy of cervical region 04/21/2022   White matter abnormality on MRI of brain 06/02/2021   Occipital neuralgia of right side 06/02/2021   Essential hypertension 01/05/2016   Pain syndrome, chronic 02/16/2013    Social Hx   Social History   Socioeconomic History   Marital status: Legally Separated    Spouse name: Not on file   Number of children: 2   Years of education: Not on file   Highest education level: High school graduate  Occupational History   Not on file  Tobacco Use   Smoking status: Never   Smokeless tobacco: Never  Vaping Use   Vaping Use: Never used  Substance and Sexual Activity   Alcohol use: Never   Drug use: Never   Sexual activity: Not on file  Other Topics Concern   Not on file  Social History Narrative   Lives alone   Right handed   Caffeine: 2 cups of  coffee and 1 glass of tea, and half a soda a day   Social Determinants of Health   Financial Resource Strain: Not on file  Food Insecurity: Not on file  Transportation Needs: Not on file  Physical Activity: Not on file  Stress: Not on file  Social Connections: Not on file    Review of Systems Per HPI  Objective:  BP 137/79   Ht 5' (1.524 m)   Wt 115 lb 12.8 oz (52.5 kg)   BMI 22.62 kg/m      07/27/2022    2:31 PM 04/21/2022    1:11 PM 02/18/2022    1:40 PM  BP/Weight  Systolic BP 102 725 366  Diastolic BP 79 78 85  Wt. (Lbs) 115.8 114   BMI 22.62 kg/m2 22.26 kg/m2     Physical Exam Vitals and nursing note reviewed.  Constitutional:      General: She is not in acute distress.    Appearance: Normal appearance.  HENT:     Head: Normocephalic and atraumatic.  Eyes:     General:        Right eye: No discharge.        Left eye: No discharge.     Conjunctiva/sclera: Conjunctivae normal.  Cardiovascular:     Rate and Rhythm: Normal rate and regular rhythm.  Pulmonary:     Effort: Pulmonary effort is normal.     Breath sounds: Normal breath sounds.  No wheezing, rhonchi or rales.  Neurological:     Mental Status: She is alert.    Assessment & Plan:   Problem List Items Addressed This Visit       Cardiovascular and Mediastinum   Essential hypertension - Primary    Well controlled. Continue Amlodipine.         Nervous and Auditory   Occipital neuralgia of right side    Continue follow up with pain medicine and neurology.         Other   Hyperlipidemia    Lipid panel ordered. Will need increase in Crestor if LDL not below 70. Continue current dosing while awaiting lab results.      Relevant Orders   Lipid panel   Insomnia    Will continue Klonopin QHS.      Macrocytosis    Chart reflects history of B12 deficiency. Awaiting lab results.       Relevant Orders   CBC   Vitamin B12   Folate   Other Visit Diagnoses     High risk medication use        Relevant Orders   CMP14+EGFR       Follow-up:  6 months   Frannie Shedrick Lacinda Axon DO [place]

## 2022-07-28 NOTE — Assessment & Plan Note (Signed)
Chart reflects history of B12 deficiency. Awaiting lab results.

## 2022-07-28 NOTE — Assessment & Plan Note (Signed)
Continue follow up with pain medicine and neurology.

## 2022-07-28 NOTE — Assessment & Plan Note (Signed)
Will continue Klonopin QHS.

## 2022-07-28 NOTE — Assessment & Plan Note (Signed)
Well controlled. Continue Amlodipine.

## 2022-07-28 NOTE — Assessment & Plan Note (Signed)
Lipid panel ordered. Will need increase in Crestor if LDL not below 70. Continue current dosing while awaiting lab results.

## 2022-08-12 ENCOUNTER — Encounter: Payer: Self-pay | Admitting: Family Medicine

## 2022-08-12 ENCOUNTER — Other Ambulatory Visit: Payer: Self-pay | Admitting: Family Medicine

## 2022-08-12 MED ORDER — CLONAZEPAM 0.5 MG PO TABS: 0.5000 mg | ORAL_TABLET | Freq: Every evening | ORAL | 3 refills | Status: DC | PRN

## 2022-08-16 ENCOUNTER — Other Ambulatory Visit: Payer: Self-pay

## 2022-08-16 MED ORDER — GABAPENTIN 600 MG PO TABS: 900.0000 mg | ORAL_TABLET | Freq: Four times a day (QID) | ORAL | 1 refills | Status: DC

## 2022-09-06 ENCOUNTER — Encounter: Payer: Self-pay | Admitting: Neurology

## 2022-09-06 ENCOUNTER — Other Ambulatory Visit: Payer: Self-pay

## 2022-09-06 MED ORDER — BACLOFEN 20 MG PO TABS: 20.0000 mg | ORAL_TABLET | Freq: Three times a day (TID) | ORAL | 1 refills | Status: DC

## 2022-09-06 MED ORDER — AMLODIPINE BESYLATE 10 MG PO TABS: 10.0000 mg | ORAL_TABLET | Freq: Every day | ORAL | 1 refills | Status: DC

## 2022-09-06 MED ORDER — OMEPRAZOLE 20 MG PO CPDR: 20.0000 mg | DELAYED_RELEASE_CAPSULE | Freq: Every day | ORAL | 1 refills | Status: DC

## 2022-09-06 NOTE — Telephone Encounter (Signed)
Scheduled on 04/23/23 for follow up. Per note on 04/21/22 "Continue gabapentin, Flexeril, duloxetine and baclofen for occipital neuralgia/trigeminal neuralgia. "

## 2022-09-09 ENCOUNTER — Encounter: Payer: PPO | Admitting: Podiatry

## 2022-09-14 ENCOUNTER — Ambulatory Visit
Admission: EM | Admit: 2022-09-14 | Discharge: 2022-09-14 | Disposition: A | Discharge status: Home / Self Care | Payer: PPO | Attending: Nurse Practitioner | Admitting: Nurse Practitioner

## 2022-09-14 ENCOUNTER — Telehealth: Payer: Self-pay | Admitting: Podiatry

## 2022-09-14 ENCOUNTER — Encounter: Payer: Self-pay | Admitting: Emergency Medicine

## 2022-09-14 DIAGNOSIS — J069 Acute upper respiratory infection, unspecified: Secondary | ICD-10-CM | POA: Diagnosis not present

## 2022-09-14 MED ORDER — FLUTICASONE PROPIONATE 50 MCG/ACT NA SUSP: 2.0000 | Freq: Every day | NASAL | 0 refills | Status: DC

## 2022-09-14 MED ORDER — PSEUDOEPH-BROMPHEN-DM 30-2-10 MG/5ML PO SYRP: 5.0000 mL | ORAL_SOLUTION | Freq: Four times a day (QID) | ORAL | 0 refills | Status: DC | PRN

## 2022-09-14 MED ORDER — CETIRIZINE HCL 10 MG PO TABS: 10.0000 mg | ORAL_TABLET | Freq: Every day | ORAL | 0 refills | Status: DC

## 2022-09-14 NOTE — ED Triage Notes (Signed)
Scratchy throat, runny nose, sneezing since Sunday.  Cough since yesterday.  Has been taking over the counter cough medication.  Home covid test yesterday was negative.

## 2022-09-14 NOTE — ED Provider Notes (Signed)
[facility]    CSN: [id number] Arrival date & time: 09/14/22  1110      History   Chief Complaint No chief complaint on file.   HPI Regina Neal is a 67 y.o. female.   The history is provided by the patient.   Patient presents with a 2-day history of scratchy throat, runny nose, headache, sneezing, right ear pressure, and cough.  Patient denies fever, chills, ear drainage, wheezing, shortness of breath, difficulty breathing, or GI symptoms.  Patient reports she has been taking over-the-counter cough and cold medication with minimal relief.  She reports that she took a home COVID test 1 day ago which was negative.  Patient denies any obvious known sick contacts.  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      Home Medications    Prior to Admission medications   Medication Sig Start Date End Date Taking? Authorizing Provider  brompheniramine-pseudoephedrine-DM 30-2-10 MG/5ML syrup Take 5 mLs by mouth 4 (four) times daily as needed. 09/14/22  Yes Yareth Macdonnell-Warren, Alda Lea, NP  cetirizine (ZYRTEC) 10 MG tablet Take 1 tablet (10 mg total) by mouth daily. 09/14/22  Yes Nassir Neidert-Warren,  Alda Lea, NP  fluticasone (FLONASE) 50 MCG/ACT nasal spray Place 2 sprays into both nostrils daily. 09/14/22  Yes Italo Banton-Warren, Alda Lea, NP  amLODipine (NORVASC) 10 MG tablet Take 1 tablet (10 mg total) by mouth daily. 09/06/22   Coral Spikes, DO  Ascorbic Acid (VITAMIN C) 500 MG CAPS Take 2 capsules by mouth every other day.    [provider]  aspirin 81 MG chewable tablet Chew by mouth daily.    [provider]  baclofen (LIORESAL) 20 MG tablet Take 1 tablet (20 mg total) by mouth 3 (three) times daily. 09/06/22   Sater, Nanine Means, MD  calcium carbonate (OSCAL) 1500 (600 Ca) MG TABS tablet Take by mouth daily with breakfast.    [provider]  cholecalciferol (VITAMIN D) 1000 units tablet Take 2,000 Units by mouth daily.    [provider]  clonazePAM (KLONOPIN) 0.5 MG tablet Take 1 tablet (0.5 mg total) by mouth at bedtime as needed. 08/12/22   Coral Spikes, DO  cyclobenzaprine (FLEXERIL) 5 MG tablet Take 1 tablet (5 mg total) by mouth every 8 (eight) hours as needed for muscle spasms. 10/20/21   Sater, Nanine Means, MD  DULoxetine (CYMBALTA) 20 MG capsule Take 20 mg by mouth every morning. 03/24/22   [provider]  gabapentin (NEURONTIN) 600 MG tablet Take 1.5 tablets (900 mg total) by mouth 4 (four) times daily. 08/16/22   Sater, Nanine Means, MD  Multiple Vitamin (MULTI-VITAMIN DAILY) TABS Take 1 tablet  by mouth every other day.    [provider]  NON FORMULARY Apply 1 patch topically as needed. Lidoderm patch    [provider]  Omega-3 Fatty Acids (FISH OIL OMEGA-3 PO) Take 1 tablet by mouth every other day.    [provider]  omeprazole (PRILOSEC) 20 MG capsule Take 1 capsule (20 mg total) by mouth daily. 09/06/22   Tommie Sams, DO  rizatriptan (MAXALT) 10 MG tablet Take 1 tablet (10 mg total) by mouth as needed for migraine. May repeat in 2 hours if needed 11/23/21   Sater, Pearletha Furl, MD  rosuvastatin (CRESTOR) 20 MG  tablet Take by mouth. 09/30/21   [provider]  vitamin E 180 MG (400 UNITS) capsule Take 1 capsule by mouth every other day.    [provider]    Family History Family History  Problem Relation Age of Onset   Cancer Mother    Lung cancer Father    Stroke Maternal Grandmother     Social History Social History   Tobacco Use   Smoking status: Never   Smokeless tobacco: Never  Vaping Use   Vaping Use: Never used  Substance Use Topics   Alcohol use: Never   Drug use: Never     Allergies   Buprenorphine and Verapamil   Review of Systems Review of Systems Per HPI  Physical Exam Triage Vital Signs ED Triage Vitals  Enc Vitals Group     BP 09/14/22 1114 136/80     Pulse Rate 09/14/22 1114 (!) 103     Resp 09/14/22 1114 18     Temp 09/14/22 1114 98.2 F (36.8 C)     Temp Source 09/14/22 1114 Oral     SpO2 09/14/22 1114 98 %     Weight --      Height --      Head Circumference --      Peak Flow --      Pain Score 09/14/22 1116 0     Pain Loc --      Pain Edu? --      Excl. in GC? --    No data found.  Updated Vital Signs BP 136/80 (BP Location: Right Arm)   Pulse (!) 103   Temp 98.2 F (36.8 C) (Oral)   Resp 18   SpO2 98%   Visual Acuity Right Eye Distance:   Left Eye Distance:   Bilateral Distance:    Right Eye Near:   Left Eye Near:    Bilateral Near:     Physical Exam Vitals and nursing note reviewed.  Constitutional:      General: She is not in acute distress.    Appearance: Normal appearance.  HENT:     Head: Normocephalic.     Right Ear: Ear canal and external ear normal. A middle ear effusion is present.     Left Ear: Tympanic membrane, ear canal and external ear normal.     Nose: Congestion present. No rhinorrhea.     Right Turbinates: Enlarged and swollen.     Left Turbinates: Enlarged and swollen.     Right Sinus: No maxillary sinus tenderness or frontal sinus tenderness.     Left Sinus: No maxillary sinus  tenderness or frontal sinus tenderness.     Mouth/Throat:     Lips: Pink.     Mouth: Mucous membranes are moist.     Pharynx: Oropharynx is clear. Uvula midline. Posterior oropharyngeal erythema present. No pharyngeal swelling,  oropharyngeal exudate or uvula swelling.     Tonsils: No tonsillar exudate.     Comments: Cobblestoning present on the posterior oropharynx Eyes:     Extraocular Movements: Extraocular movements intact.     Conjunctiva/sclera: Conjunctivae normal.     Pupils: Pupils are equal, round, and reactive to light.  Cardiovascular:     Rate and Rhythm: Normal rate.     Pulses: Normal pulses.     Heart sounds: Normal heart sounds.  Pulmonary:     Effort: Pulmonary effort is normal. No respiratory distress.     Breath sounds: Normal breath sounds. No stridor. No wheezing, rhonchi or rales.  Abdominal:     General: Bowel sounds are normal.     Palpations: Abdomen is soft.     Tenderness: There is no abdominal tenderness.  Musculoskeletal:     Cervical back: Normal range of motion.  Lymphadenopathy:     Cervical: No cervical adenopathy.  Skin:    General: Skin is warm and dry.  Neurological:     General: No focal deficit present.     Mental Status: She is alert and oriented to person, place, and time.  Psychiatric:        Mood and Affect: Mood normal.        Behavior: Behavior normal.      UC Treatments / Results  Labs (all labs ordered are listed, but only abnormal results are displayed) Labs Reviewed - No data to display  EKG   Radiology No results found.  Procedures Procedures (including critical care time)  Medications Ordered in UC Medications - No data to display  Initial Impression / Assessment and Plan / UC Course  I have reviewed the triage vital signs and the nursing notes.  Pertinent labs & imaging results that were available during my care of the patient were reviewed by me and considered in my medical decision making (see chart for  details).  The patient is well-appearing, she is in no acute distress, vital signs are stable, although she is mildly tachycardic.  Suspect a viral upper respiratory infection with cough.  Patient took a home COVID test which was negative, no indication to repeat test at this time.  Symptoms likely consistent with postnasal drainage at this time.  For patient's symptoms, symptomatic treatment include cetirizine 10 mg to act as a decongestant and to help with nasal drainage, Bromfed-DM to work as an antihistamine and to help with her cough, and fluticasone 50 mcg nasal spray for her nasal congestion and right middle ear effusion.  Supportive care recommendations were provided to the patient to include increasing fluids, allowing for plenty of rest, and use of Tylenol as needed for pain or discomfort.  Discussed viral etiology with the patient and when indication may be necessary.  Patient verbalizes understanding.  All questions were answered.  Patient stable for discharge.  Final Clinical Impressions(s) / UC Diagnoses   Final diagnoses:  Viral upper respiratory tract infection with cough     Discharge Instructions      You have a viral upper respiratory infection.  Symptoms should improve over the next week to 10 days.  If you develop chest pain or shortness of breath, go to the emergency room.    Take medication as prescribed. Please be advised that the cough medication may elevate your blood pressure. If your blood pressure is higher than 140/90 while taking the medication, stop the medication. You can begin taking over-the-counter Coricidin HBP or Mucinex for people  with high blood pressure and diabetes while your symptoms persist.   Some things that can make you feel better are: - Increased rest - Increasing fluid with water/sugar free electrolytes - Acetaminophen and ibuprofen as needed for fever/pain - Salt water gargling, chloraseptic spray and throat lozenges - Saline sinus flushes  or a neti pot - Humidifying the air  As discussed, if symptoms do not improve over the next 7 to 10 days, or if they suddenly worsen, please follow-up in this clinic or with your primary care physician for further evaluation.      ED Prescriptions     Medication Sig Dispense Auth. Provider   fluticasone (FLONASE) 50 MCG/ACT nasal spray Place 2 sprays into both nostrils daily. 16 g Bernis Stecher-Warren, Sadie Haber, NP   cetirizine (ZYRTEC) 10 MG tablet Take 1 tablet (10 mg total) by mouth daily. 30 tablet Jeramine Delis-Warren, Sadie Haber, NP   brompheniramine-pseudoephedrine-DM 30-2-10 MG/5ML syrup Take 5 mLs by mouth 4 (four) times daily as needed. 140 mL Cason Dabney-Warren, Sadie Haber, NP      PDMP not reviewed this encounter.   Abran Cantor, NP 09/14/22 1132

## 2022-09-14 NOTE — Discharge Instructions (Addendum)
You have a viral upper respiratory infection.  Symptoms should improve over the next week to 10 days.  If you develop chest pain or shortness of breath, go to the emergency room.    Take medication as prescribed. Please be advised that the cough medication may elevate your blood pressure. If your blood pressure is higher than 140/90 while taking the medication, stop the medication. You can begin taking over-the-counter Coricidin HBP or Mucinex for people with high blood pressure and diabetes while your symptoms persist.   Some things that can make you feel better are: - Increased rest - Increasing fluid with water/sugar free electrolytes - Acetaminophen and ibuprofen as needed for fever/pain - Salt water gargling, chloraseptic spray and throat lozenges - Saline sinus flushes or a neti pot - Humidifying the air  As discussed, if symptoms do not improve over the next 7 to 10 days, or if they suddenly worsen, please follow-up in this clinic or with your primary care physician for further evaluation.

## 2022-09-14 NOTE — Telephone Encounter (Signed)
DOS: 10/08/2022  [employer]  [facility] Procedure inc Bunionectomy Rt [phone]) [place] Osteotomy Rt [phone]) Metatarsal Osteotomy 2nd Rt (09811) Hammertoe Repair 2nd Rt ([id number]) Bone Graft Rt (20900)  Authorization #: [id number] Authorization Valid: 10/08/2022 - 01/06/2023

## 2022-09-17 ENCOUNTER — Ambulatory Visit
Admission: EM | Admit: 2022-09-17 | Discharge: 2022-09-17 | Disposition: A | Discharge status: Home / Self Care | Payer: PPO | Attending: Nurse Practitioner | Admitting: Nurse Practitioner

## 2022-09-17 DIAGNOSIS — J069 Acute upper respiratory infection, unspecified: Secondary | ICD-10-CM

## 2022-09-17 DIAGNOSIS — J040 Acute laryngitis: Secondary | ICD-10-CM

## 2022-09-17 MED ORDER — BENZONATATE 100 MG PO CAPS: 100.0000 mg | ORAL_CAPSULE | Freq: Three times a day (TID) | ORAL | 0 refills | Status: DC | PRN

## 2022-09-17 NOTE — ED Provider Notes (Signed)
[facility]    CSN: [id number] Arrival date & time: 09/17/22  1056      History   Chief Complaint Chief Complaint  Patient presents with   Cough    HPI Regina Neal is a 67 y.o. female.   Patient presents today for 5 days of congested cough, chest congestion, postnasal drainage, sore throat from coughing, headache from coughing, and fatigue.  Patient denies fever, bodies or chills.  No shortness of breath or chest pain.  No pain in her chest when she takes a deep breath.  No nasal congestion, runny nose, sneezing, sinus pressure, ear pain or pressure, abdominal pain, nausea/vomiting, diarrhea, decreased appetite, or loss of taste/smell.  She was seen earlier this week in urgent care and started on Zyrtec, Flonase, and Bromfed which she states have not really helped her symptoms.  Reports that coughing is the worst.  Patient denies history of chronic lung disease; has never needed to use inhalers before.    Past Medical History:  Diagnosis Date   Asthma    exercised induced   Hypertension    Multiple sclerosis ([place])    Trigeminal neuralgia     Patient Active Problem List   Diagnosis Date Noted   Migraine 07/28/2022   Hyperlipidemia 07/28/2022   Insomnia 07/28/2022   Osteopenia 07/28/2022   GERD (gastroesophageal reflux disease) 07/28/2022   Macrocytosis 07/28/2022   Trigeminal neuralgia 07/27/2022   Cerebellar infarct ([place]) 07/27/2022   Facet hypertrophy of cervical region 04/21/2022   White matter abnormality on MRI of brain 06/02/2021   Occipital neuralgia of right side 06/02/2021   Essential hypertension 01/05/2016   Pain syndrome, chronic 02/16/2013    Past Surgical History:  Procedure Laterality Date   INSERTION / PLACEMENT / REVISION NEUROSTIMULATOR Right 2015    OB History   No obstetric history on file.      Home Medications    Prior to Admission medications   Medication Sig Start Date End Date Taking? Authorizing Provider   benzonatate (TESSALON) 100 MG capsule Take 1 capsule (100 mg total) by mouth 3 (three) times daily as needed for cough. Do not take with alcohol or while driving or operating heavy machinery.  May cause drowsiness. 09/17/22  Yes Noemi Chapel A, NP  amLODipine (NORVASC) 10 MG tablet Take 1 tablet (10 mg total) by mouth daily. 09/06/22   Coral Spikes, DO  Ascorbic Acid (VITAMIN C) 500 MG CAPS Take 2 capsules by mouth every other day.    [provider]  aspirin 81 MG chewable tablet Chew by mouth daily.    [provider]  baclofen (LIORESAL) 20 MG tablet Take 1 tablet (20 mg total) by mouth 3 (three) times daily. 09/06/22   Sater, Nanine Means, MD  brompheniramine-pseudoephedrine-DM 30-2-10 MG/5ML syrup Take 5 mLs by mouth 4 (four) times daily as needed. 09/14/22   Leath-Warren, Alda Lea, NP  calcium carbonate (OSCAL) 1500 (600 Ca) MG TABS tablet Take by mouth daily with breakfast.    [provider]  cetirizine (ZYRTEC) 10 MG tablet Take 1 tablet (10 mg total) by mouth daily. 09/14/22   Leath-Warren, Alda Lea, NP  cholecalciferol (VITAMIN D) 1000 units tablet Take 2,000 Units by mouth daily.    [provider]  clonazePAM (KLONOPIN) 0.5 MG tablet Take 1 tablet (0.5 mg total) by mouth at bedtime as needed. 08/12/22   Coral Spikes, DO  cyclobenzaprine (FLEXERIL) 5 MG tablet Take 1 tablet (5 mg total) by mouth  every 8 (eight) hours as needed for muscle spasms. 10/20/21   Sater, Pearletha Furl, MD  DULoxetine (CYMBALTA) 20 MG capsule Take 20 mg by mouth every morning. 03/24/22   [provider]  fluticasone (FLONASE) 50 MCG/ACT nasal spray Place 2 sprays into both nostrils daily. 09/14/22   Leath-Warren, Sadie Haber, NP  gabapentin (NEURONTIN) 600 MG tablet Take 1.5 tablets (900 mg total) by mouth 4 (four) times daily. 08/16/22   Sater, Pearletha Furl, MD  Multiple Vitamin (MULTI-VITAMIN DAILY) TABS Take 1 tablet by mouth every other day.    [provider]  NON  FORMULARY Apply 1 patch topically as needed. Lidoderm patch    [provider]  Omega-3 Fatty Acids (FISH OIL OMEGA-3 PO) Take 1 tablet by mouth every other day.    [provider]  omeprazole (PRILOSEC) 20 MG capsule Take 1 capsule (20 mg total) by mouth daily. 09/06/22   Tommie Sams, DO  rizatriptan (MAXALT) 10 MG tablet Take 1 tablet (10 mg total) by mouth as needed for migraine. May repeat in 2 hours if needed 11/23/21   Sater, Pearletha Furl, MD  rosuvastatin (CRESTOR) 20 MG tablet Take by mouth. 09/30/21   [provider]  vitamin E 180 MG (400 UNITS) capsule Take 1 capsule by mouth every other day.    [provider]    Family History Family History  Problem Relation Age of Onset   Cancer Mother    Lung cancer Father    Stroke Maternal Grandmother     Social History Social History   Tobacco Use   Smoking status: Never   Smokeless tobacco: Never  Vaping Use   Vaping Use: Never used  Substance Use Topics   Alcohol use: Never   Drug use: Never     Allergies   Buprenorphine and Verapamil   Review of Systems Review of Systems Per HPI  Physical Exam Triage Vital Signs ED Triage Vitals  Enc Vitals Group     BP 09/17/22 1148 117/77     Pulse Rate 09/17/22 1148 98     Resp 09/17/22 1148 18     Temp 09/17/22 1148 98.3 F (36.8 C)     Temp Source 09/17/22 1148 Oral     SpO2 09/17/22 1148 93 %     Weight --      Height --      Head Circumference --      Peak Flow --      Pain Score 09/17/22 1152 0     Pain Loc --      Pain Edu? --      Excl. in GC? --    No data found.  Updated Vital Signs BP 117/77 (BP Location: Right Arm)   Pulse 98   Temp 98.3 F (36.8 C) (Oral)   Resp 18   SpO2 93%   Oxygen saturation recheck: 96% on room air  Visual Acuity Right Eye Distance:   Left Eye Distance:   Bilateral Distance:    Right Eye Near:   Left Eye Near:    Bilateral Near:     Physical Exam Vitals and nursing note reviewed.   Constitutional:      General: She is not in acute distress.    Appearance: Normal appearance. She is not ill-appearing or toxic-appearing.  HENT:     Head: Normocephalic and atraumatic.     Right Ear: Ear canal and external ear normal. A middle ear effusion is present.  Left Ear: Ear canal and external ear normal. A middle ear effusion is present.     Nose: No congestion or rhinorrhea.     Mouth/Throat:     Mouth: Mucous membranes are moist.     Pharynx: Oropharynx is clear. Posterior oropharyngeal erythema present. No oropharyngeal exudate.  Eyes:     General: No scleral icterus.    Extraocular Movements: Extraocular movements intact.  Cardiovascular:     Rate and Rhythm: Normal rate and regular rhythm.  Pulmonary:     Effort: Pulmonary effort is normal. No respiratory distress.     Breath sounds: Normal breath sounds. No wheezing, rhonchi or rales.  Abdominal:     General: Abdomen is flat. Bowel sounds are normal. There is no distension.     Palpations: Abdomen is soft.     Tenderness: There is no abdominal tenderness.  Musculoskeletal:     Cervical back: Normal range of motion and neck supple.  Lymphadenopathy:     Cervical: No cervical adenopathy.  Skin:    General: Skin is warm and dry.     Coloration: Skin is not jaundiced or pale.     Findings: No erythema or rash.  Neurological:     Mental Status: She is alert and oriented to person, place, and time.  Psychiatric:        Behavior: Behavior is cooperative.      UC Treatments / Results  Labs (all labs ordered are listed, but only abnormal results are displayed) Labs Reviewed - No data to display  EKG   Radiology No results found.  Procedures Procedures (including critical care time)  Medications Ordered in UC Medications - No data to display  Initial Impression / Assessment and Plan / UC Course  I have reviewed the triage vital signs and the nursing notes.  Pertinent labs & imaging results that  were available during my care of the patient were reviewed by me and considered in my medical decision making (see chart for details).   Patient is well-appearing, normotensive, afebrile, not tachycardic, not tachypneic, oxygenating well on room air.    1. Viral URI with cough 2. Laryngitis Suspect laryngitis secondary to viral upper respiratory infection Continue supportive care Discussed with patient that antibiotic is not indicated currently Recommended starting plain Mucinex 600 mg twice daily to help break up congestion Examination is reassuring and vital signs are reassuring today Can use Tessalon Perles as needed for dry/hacking cough ER and return precautions discussed with patient  The patient was given the opportunity to ask questions.  All questions answered to their satisfaction.  The patient is in agreement to this plan.    Final Clinical Impressions(s) / UC Diagnoses   Final diagnoses:  Viral URI with cough  Laryngitis     Discharge Instructions      You have a viral upper respiratory infection.  Symptoms should improve over the next week to 10 days.  If you develop chest pain or shortness of breath, go to the emergency room.  Some things that can make you feel better are: - Increased rest - Increasing fluid with water/sugar free electrolytes - Acetaminophen and ibuprofen as needed for fever/pain - Salt water gargling, chloraseptic spray and throat lozenges for sore throat - OTC guaifenesin (Mucinex) 600 mg twice daily for congestion - Saline sinus flushes or a neti pot for congestion - Humidifying the air -Tessalon Perles every 8 hours as during the day as needed for dry cough     ED  Prescriptions     Medication Sig Dispense Auth. Provider   benzonatate (TESSALON) 100 MG capsule Take 1 capsule (100 mg total) by mouth 3 (three) times daily as needed for cough. Do not take with alcohol or while driving or operating heavy machinery.  May cause drowsiness. 21  capsule Eulogio Bear, NP      PDMP not reviewed this encounter.   Eulogio Bear, NP 09/17/22 1244

## 2022-09-17 NOTE — Discharge Instructions (Addendum)
You have a viral upper respiratory infection.  Symptoms should improve over the next week to 10 days.  If you develop chest pain or shortness of breath, go to the emergency room.  Some things that can make you feel better are: - Increased rest - Increasing fluid with water/sugar free electrolytes - Acetaminophen and ibuprofen as needed for fever/pain - Salt water gargling, chloraseptic spray and throat lozenges for sore throat - OTC guaifenesin (Mucinex) 600 mg twice daily for congestion - Saline sinus flushes or a neti pot for congestion - Humidifying the air -Tessalon Perles every 8 hours as during the day as needed for dry cough

## 2022-09-17 NOTE — ED Triage Notes (Signed)
Pt reports cough, congestion, hoarse x 4 days. Pt started brompheniramine-pseudoephedrine-DM, cetirizine and  fluticasone on /16/24.

## 2022-09-21 ENCOUNTER — Ambulatory Visit (HOSPITAL_COMMUNITY)
Admission: RE | Admit: 2022-09-21 | Discharge: 2022-09-21 | Disposition: A | Discharge status: Home / Self Care | Payer: PPO | Source: Ambulatory Visit | Attending: Family Medicine | Admitting: Family Medicine

## 2022-09-21 ENCOUNTER — Ambulatory Visit (INDEPENDENT_AMBULATORY_CARE_PROVIDER_SITE_OTHER): Payer: PPO | Admitting: Family Medicine

## 2022-09-21 ENCOUNTER — Encounter: Payer: Self-pay | Admitting: Family Medicine

## 2022-09-21 VITALS — BP 109/68 | HR 118 | Temp 99.3°F | Wt 115.2 lb

## 2022-09-21 DIAGNOSIS — J189 Pneumonia, unspecified organism: Secondary | ICD-10-CM

## 2022-09-21 DIAGNOSIS — R051 Acute cough: Secondary | ICD-10-CM | POA: Diagnosis not present

## 2022-09-21 DIAGNOSIS — R059 Cough, unspecified: Secondary | ICD-10-CM | POA: Diagnosis not present

## 2022-09-21 MED ORDER — AMOXICILLIN-POT CLAVULANATE 875-125 MG PO TABS: 1.0000 | ORAL_TABLET | Freq: Two times a day (BID) | ORAL | 0 refills | Status: DC

## 2022-09-21 MED ORDER — AZITHROMYCIN 250 MG PO TABS: ORAL_TABLET | ORAL | 0 refills | Status: AC

## 2022-09-21 MED ORDER — HYDROCODONE BIT-HOMATROP MBR 5-1.5 MG/5ML PO SOLN: 5.0000 mL | Freq: Three times a day (TID) | ORAL | 0 refills | Status: DC | PRN

## 2022-09-21 NOTE — Patient Instructions (Signed)
We will call with the chest x-ray results.  Awaiting swab results as well.  You can stop the medications that you have been taking. I sent in something for your cough while waiting the chest x-ray results.  Take care  Dr. Lacinda Axon

## 2022-09-21 NOTE — Addendum Note (Signed)
Addended by: Coral Spikes on: 09/21/2022 02:14 PM   Modules accepted: Orders, Level of Service

## 2022-09-21 NOTE — Assessment & Plan Note (Signed)
X-ray was independently reviewed by me.  Interpretation: Left lower lobe pneumonia.  Treating with azithromycin and Augmentin.

## 2022-09-21 NOTE — Assessment & Plan Note (Addendum)
Patient with cough and laryngitis.  Awaiting flu/COVID/RSV testing.  Chest x-ray today for further evaluation given persistent symptoms and tachycardia. Hycodan for cough.

## 2022-09-21 NOTE — Progress Notes (Addendum)
Subjective:  Patient ID: Regina Neal, female    DOB: 1955-09-16  Age: 67 y.o. MRN: [id number]  CC: Chief Complaint  Patient presents with   Cough    Deep productive cough, sore throat, laryngitis. Headache but that is not unusual for pt. Went to Urgent Care twice. Began about one week ago     HPI:  67 year old female presents for evaluation of respiratory symptoms.  Patient has been sick for 1 week.  She has been seen in urgent care twice.  Diagnosed and treated for viral respiratory infection.  Patient has not improved despite several medications.  Patient has hoarseness and a deep cough.  She has had headaches as well.  She has chronic headaches.  No fever.  Predominant symptom is the cough.  No known exacerbating factors.  No other complaints.  Patient Active Problem List   Diagnosis Date Noted   Acute cough 09/21/2022   Community acquired pneumonia of left lower lobe of lung 09/21/2022   Migraine 07/28/2022   Hyperlipidemia 07/28/2022   Insomnia 07/28/2022   Osteopenia 07/28/2022   GERD (gastroesophageal reflux disease) 07/28/2022   Macrocytosis 07/28/2022   Trigeminal neuralgia 07/27/2022   Cerebellar infarct ([place]) 07/27/2022   Facet hypertrophy of cervical region 04/21/2022   White matter abnormality on MRI of brain 06/02/2021   Occipital neuralgia of right side 06/02/2021   Essential hypertension 01/05/2016   Pain syndrome, chronic 02/16/2013    Social Hx   Social History   Socioeconomic History   Marital status: Legally Separated    Spouse name: Not on file   Number of children: 2   Years of education: Not on file   Highest education level: High school graduate  Occupational History   Not on file  Tobacco Use   Smoking status: Never   Smokeless tobacco: Never  Vaping Use   Vaping Use: Never used  Substance and Sexual Activity   Alcohol use: Never   Drug use: Never   Sexual activity: Not on file  Other Topics Concern   Not on file  Social History  Narrative   Lives alone   Right handed   Caffeine: 2 cups of coffee and 1 glass of tea, and half a soda a day   Social Determinants of Health   Financial Resource Strain: Not on file  Food Insecurity: Not on file  Transportation Needs: Not on file  Physical Activity: Not on file  Stress: Not on file  Social Connections: Not on file    Review of Systems Per HPI  Objective:  BP 109/68   Pulse (!) 118   Temp 99.3 F (37.4 C)   Wt 115 lb 3.2 oz (52.3 kg)   SpO2 95%   BMI 22.50 kg/m      09/21/2022   11:23 AM 09/17/2022   11:48 AM 09/14/2022   11:14 AM  BP/Weight  Systolic BP 660 630 160  Diastolic BP 68 77 80  Wt. (Lbs) 115.2    BMI 22.5 kg/m2      Physical Exam Vitals and nursing note reviewed.  Constitutional:      Appearance: Normal appearance.  HENT:     Head: Normocephalic and atraumatic.  Eyes:     General:        Right eye: No discharge.        Left eye: No discharge.     Conjunctiva/sclera: Conjunctivae normal.  Cardiovascular:     Rate and Rhythm: Regular rhythm. Tachycardia present.  Pulmonary:  Effort: Pulmonary effort is normal.     Breath sounds: No wheezing or rales.  Neurological:     Mental Status: She is alert.  Psychiatric:        Mood and Affect: Mood normal.        Behavior: Behavior normal.     Assessment & Plan:   Problem List Items Addressed This Visit       Respiratory   Community acquired pneumonia of left lower lobe of lung - Primary    X-ray was independently reviewed by me.  Interpretation: Left lower lobe pneumonia.  Treating with azithromycin and Augmentin.      Relevant Medications   HYDROcodone bit-homatropine (HYCODAN) 5-1.5 MG/5ML syrup   amoxicillin-clavulanate (AUGMENTIN) 875-125 MG tablet   azithromycin (ZITHROMAX) 250 MG tablet     Other   Acute cough    Patient with cough and laryngitis.  Awaiting flu/COVID/RSV testing.  Chest x-ray today for further evaluation given persistent symptoms and  tachycardia. Hycodan for cough.      Relevant Orders   DG Chest 2 View (Completed)   COVID-19, Flu A+B and RSV    Meds ordered this encounter  Medications   HYDROcodone bit-homatropine (HYCODAN) 5-1.5 MG/5ML syrup    Sig: Take 5 mLs by mouth every 8 (eight) hours as needed for cough.    Dispense:  120 mL    Refill:  0   amoxicillin-clavulanate (AUGMENTIN) 875-125 MG tablet    Sig: Take 1 tablet by mouth 2 (two) times daily.    Dispense:  14 tablet    Refill:  0   azithromycin (ZITHROMAX) 250 MG tablet    Sig: Take 2 tablets on day 1, then 1 tablet daily on days 2 through 5    Dispense:  6 tablet    Refill:  0    Follow-up:  Return if symptoms worsen or fail to improve.  [place]

## 2022-09-22 ENCOUNTER — Other Ambulatory Visit: Payer: Self-pay | Admitting: Family Medicine

## 2022-09-22 ENCOUNTER — Telehealth: Payer: Self-pay | Admitting: Family Medicine

## 2022-09-22 ENCOUNTER — Telehealth: Payer: Self-pay

## 2022-09-22 LAB — COVID-19, FLU A+B AND RSV
Influenza A, NAA: NOT DETECTED
Influenza B, NAA: NOT DETECTED
RSV, NAA: NOT DETECTED
SARS-CoV-2, NAA: NOT DETECTED

## 2022-09-22 MED ORDER — ONDANSETRON 4 MG PO TBDP: 4.0000 mg | ORAL_TABLET | Freq: Three times a day (TID) | ORAL | 0 refills | Status: DC | PRN

## 2022-09-22 NOTE — Telephone Encounter (Signed)
Pt called in stating that the antibiotic that she was prescribed recently is making her very sick. Pt is asking for something for nausea to be sent to the pharmacy. Please advise.  Cb#: [phone]

## 2022-09-22 NOTE — Telephone Encounter (Signed)
  Left message for patient to call back and schedule Medicare Annual Wellness Visit (AWV) in office.   If unable to come into the office for AWV,  please offer to do virtually or by telephone.  No hx of AWV eligible for AWVI per [facility] as of 01/01/202   Please schedule at anytime with [facility]-Nurse Health Advisor.      45 minute appointment   Any questions, please call me.  Thank you,   Stephanie  Ambulatory Clinical Support for [facility] ??[phone] or ??[phone]

## 2022-09-23 ENCOUNTER — Encounter: Payer: PPO | Admitting: Podiatry

## 2022-09-24 ENCOUNTER — Other Ambulatory Visit: Payer: Self-pay | Admitting: Family Medicine

## 2022-09-24 ENCOUNTER — Encounter: Payer: Self-pay | Admitting: Family Medicine

## 2022-09-24 MED ORDER — HYDROCODONE BIT-HOMATROP MBR 5-1.5 MG PO TABS: 1.0000 | ORAL_TABLET | Freq: Three times a day (TID) | ORAL | 0 refills | Status: DC | PRN

## 2022-10-06 ENCOUNTER — Encounter: Payer: PPO | Admitting: Family Medicine

## 2022-10-08 ENCOUNTER — Other Ambulatory Visit: Payer: Self-pay | Admitting: Podiatry

## 2022-10-08 DIAGNOSIS — M2011 Hallux valgus (acquired), right foot: Secondary | ICD-10-CM

## 2022-10-08 DIAGNOSIS — M2041 Other hammer toe(s) (acquired), right foot: Secondary | ICD-10-CM | POA: Diagnosis not present

## 2022-10-08 DIAGNOSIS — M7741 Metatarsalgia, right foot: Secondary | ICD-10-CM

## 2022-10-08 DIAGNOSIS — G8918 Other acute postprocedural pain: Secondary | ICD-10-CM | POA: Diagnosis not present

## 2022-10-08 DIAGNOSIS — Q66219 Congenital metatarsus primus varus, unspecified foot: Secondary | ICD-10-CM

## 2022-10-08 DIAGNOSIS — M89771 Major osseous defect, right ankle and foot: Secondary | ICD-10-CM | POA: Diagnosis not present

## 2022-10-08 DIAGNOSIS — M21611 Bunion of right foot: Secondary | ICD-10-CM | POA: Diagnosis not present

## 2022-10-08 MED ORDER — IBUPROFEN 600 MG PO TABS: 600.0000 mg | ORAL_TABLET | Freq: Four times a day (QID) | ORAL | 0 refills | Status: AC | PRN

## 2022-10-08 MED ORDER — ACETAMINOPHEN 500 MG PO TABS: 1000.0000 mg | ORAL_TABLET | Freq: Four times a day (QID) | ORAL | 0 refills | Status: AC | PRN

## 2022-10-08 MED ORDER — OXYCODONE HCL 5 MG PO TABS: 5.0000 mg | ORAL_TABLET | ORAL | 0 refills | Status: AC | PRN

## 2022-10-08 NOTE — Progress Notes (Signed)
10/08/22 Lapidus bunionectomy, 2nd hammertoe and Weil

## 2022-10-12 DIAGNOSIS — G8929 Other chronic pain: Secondary | ICD-10-CM | POA: Diagnosis not present

## 2022-10-12 DIAGNOSIS — Z4789 Encounter for other orthopedic aftercare: Secondary | ICD-10-CM | POA: Diagnosis not present

## 2022-10-12 DIAGNOSIS — E785 Hyperlipidemia, unspecified: Secondary | ICD-10-CM | POA: Diagnosis not present

## 2022-10-12 DIAGNOSIS — Z7982 Long term (current) use of aspirin: Secondary | ICD-10-CM | POA: Diagnosis not present

## 2022-10-12 DIAGNOSIS — I10 Essential (primary) hypertension: Secondary | ICD-10-CM | POA: Diagnosis not present

## 2022-10-14 ENCOUNTER — Encounter: Payer: Self-pay | Admitting: Podiatry

## 2022-10-14 ENCOUNTER — Encounter: Payer: PPO | Admitting: Podiatry

## 2022-10-14 ENCOUNTER — Ambulatory Visit (INDEPENDENT_AMBULATORY_CARE_PROVIDER_SITE_OTHER): Payer: PPO | Admitting: Podiatry

## 2022-10-14 ENCOUNTER — Ambulatory Visit (INDEPENDENT_AMBULATORY_CARE_PROVIDER_SITE_OTHER): Payer: PPO

## 2022-10-14 DIAGNOSIS — M7741 Metatarsalgia, right foot: Secondary | ICD-10-CM

## 2022-10-14 DIAGNOSIS — M2011 Hallux valgus (acquired), right foot: Secondary | ICD-10-CM

## 2022-10-14 DIAGNOSIS — M21611 Bunion of right foot: Secondary | ICD-10-CM

## 2022-10-14 DIAGNOSIS — M2041 Other hammer toe(s) (acquired), right foot: Secondary | ICD-10-CM

## 2022-10-14 NOTE — Progress Notes (Signed)
  Subjective:  Patient ID: Regina Neal, female    DOB: 11-Nov-1955,  MRN: [id number]  Chief Complaint  Patient presents with   Routine Post Op    POV #1 DOS 10/08/2022 LAPIDUS BUNIONECTOMY OF RT FOOT, POSS WEIL OSTEOTOMY OF 2ND METATARSAL, POSS AKIN OSTEOTOMY, 2ND HAMMERTOE CORRECTION    67 y.o. female returns for post-op check.  Overall doing well she is not having any pain.  Review of Systems: Negative except as noted in the HPI. Denies N/V/F/Ch.   Objective:  There were no vitals filed for this visit. There is no height or weight on file to calculate BMI. Constitutional Well developed. Well nourished.  Vascular Foot warm and well perfused. Capillary refill normal to all digits.  Calf is soft and supple, no posterior calf or knee pain, negative Homans' sign  Neurologic Normal speech. Oriented to person, place, and time. Epicritic sensation to light touch grossly present bilaterally.  Dermatologic Skin healing well without signs of infection. Skin edges well coapted without signs of infection.  Orthopedic: Tenderness to palpation noted about the surgical site.  Moderate edema   Multiple view plain film radiographs: Good correction of hallux valgus deformity, Weil osteotomy well-positioned, there is some diastases of PIPJ arthrodesis site of second toe Assessment:   1. Hallux valgus with bunions, right   2. Hammertoe of right foot   3. Metatarsalgia of right foot    Plan:  Patient was evaluated and treated and all questions answered.  S/p foot surgery right -Progressing as expected post-operatively. -XR: Noted above and reviewed, I discussed with her the diastases noted at the PIPJ.  We discussed that this may increase her risk of nonunion at this site but I have rarely had symptomatic problems of these.  Do not see indication currently to acutely revise.  There was minimal mobility today to reduce the diastases and this far out from surgery I do not expect it would overcome  any intercalary hematoma -WB Status: NWB in cam walker boot.  May begin weightbearing in boot after next visit -Sutures: Return in 2 weeks for suture removal.  May begin bathing with application of Neosporin 2 pin site before and after bathing after next visit. -Medications: No refills required -Foot redressed.  Return in about 2 weeks (around 10/28/2022) for post op (no x-rays), suture removal.

## 2022-10-21 ENCOUNTER — Encounter: Payer: PPO | Admitting: Podiatry

## 2022-10-25 ENCOUNTER — Telehealth: Payer: Self-pay | Admitting: Podiatry

## 2022-10-25 NOTE — Telephone Encounter (Signed)
Pt called and was on the nurse schedule for Thursday to get her stitches out and they moved appt to tomorrow on nurse schedule. She is concerned that you may not want to take the stitches out tomorrow 2.27 instead of 2.29 as you told her. Please advise if ok for pt to come tomorrow to get the stitches removed?

## 2022-10-26 ENCOUNTER — Ambulatory Visit (INDEPENDENT_AMBULATORY_CARE_PROVIDER_SITE_OTHER): Payer: PPO

## 2022-10-26 VITALS — BP 134/71 | HR 84

## 2022-10-26 DIAGNOSIS — M2011 Hallux valgus (acquired), right foot: Secondary | ICD-10-CM

## 2022-10-26 DIAGNOSIS — M21611 Bunion of right foot: Secondary | ICD-10-CM

## 2022-10-26 NOTE — Progress Notes (Signed)
Patient presents today for post op visit # 2 , patient of Dr. Sherryle Lis   POV #2 DOS 10/08/2022 LAPIDUS BUNIONECTOMY OF RT FOOT, POSS WEIL OSTEOTOMY OF 2ND METATARSAL, POSS AKIN OSTEOTOMY, 2ND HAMMERTOE CORRECTION    Patient presents in walking boot with knee scooter. Denies any falls or injury to the foot. Foot is slightly swollen. No signs of infection. No calf pain or shortness of breath. Bandages dry and intact. Incision is intact. Sutures were removed without complication today. Patient tolerated suture removal well. Advised patient she can began to shower. Patient to use triple antibiotic ointment around the pin daily. Advised patient she can gradually become weight bearing in the walking boot at this time. Patient verbalized understanding.  BP: 134/71 P: 84     Dr. Sherryle Lis did take a look at the foot today as well.   Foot redressed today and placed back in the boot. Reviewed icing and elevation. Patient will follow up with Dr. Sherryle Lis in 3 weeks for POV# 3 pin removal and x-rays.

## 2022-11-04 ENCOUNTER — Encounter: Payer: PPO | Admitting: Podiatry

## 2022-11-08 ENCOUNTER — Other Ambulatory Visit: Payer: Self-pay

## 2022-11-08 MED ORDER — DULOXETINE HCL 20 MG PO CPEP: 20.0000 mg | ORAL_CAPSULE | Freq: Every morning | ORAL | 3 refills | Status: DC

## 2022-11-09 DIAGNOSIS — H25043 Posterior subcapsular polar age-related cataract, bilateral: Secondary | ICD-10-CM | POA: Diagnosis not present

## 2022-11-09 DIAGNOSIS — H468 Other optic neuritis: Secondary | ICD-10-CM | POA: Diagnosis not present

## 2022-11-09 DIAGNOSIS — H18413 Arcus senilis, bilateral: Secondary | ICD-10-CM | POA: Diagnosis not present

## 2022-11-09 DIAGNOSIS — H25013 Cortical age-related cataract, bilateral: Secondary | ICD-10-CM | POA: Diagnosis not present

## 2022-11-09 DIAGNOSIS — H2513 Age-related nuclear cataract, bilateral: Secondary | ICD-10-CM | POA: Diagnosis not present

## 2022-11-09 DIAGNOSIS — H2511 Age-related nuclear cataract, right eye: Secondary | ICD-10-CM | POA: Diagnosis not present

## 2022-11-18 ENCOUNTER — Ambulatory Visit (INDEPENDENT_AMBULATORY_CARE_PROVIDER_SITE_OTHER): Payer: PPO | Admitting: Podiatry

## 2022-11-18 ENCOUNTER — Ambulatory Visit (INDEPENDENT_AMBULATORY_CARE_PROVIDER_SITE_OTHER): Payer: PPO

## 2022-11-18 DIAGNOSIS — M21611 Bunion of right foot: Secondary | ICD-10-CM

## 2022-11-18 DIAGNOSIS — M2011 Hallux valgus (acquired), right foot: Secondary | ICD-10-CM | POA: Diagnosis not present

## 2022-11-18 DIAGNOSIS — M2041 Other hammer toe(s) (acquired), right foot: Secondary | ICD-10-CM

## 2022-11-19 ENCOUNTER — Telehealth: Payer: Self-pay

## 2022-11-21 NOTE — Progress Notes (Signed)
  Subjective:  Patient ID: Regina Neal, female    DOB: [birth year],  MRN: [id number]  Chief Complaint  Patient presents with   Routine Post Op    POV #3 DOS 10/08/2022 LAPIDUS BUNIONECTOMY OF RT FOOT, POSS WEIL OSTEOTOMY OF 2ND METATARSAL, POSS AKIN OSTEOTOMY, 2ND HAMMERTOE CORRECTION (new xrays)    67 y.o. female returns for post-op check.  She is doing well not having much pain and is stiff in the toe  Review of Systems: Negative except as noted in the HPI. Denies N/V/F/Ch.   Objective:  There were no vitals filed for this visit. There is no height or weight on file to calculate BMI. Constitutional Well developed. Well nourished.  Vascular Foot warm and well perfused. Capillary refill normal to all digits.  Calf is soft and supple, no posterior calf or knee pain, negative Homans' sign  Neurologic Normal speech. Oriented to person, place, and time. Epicritic sensation to light touch grossly present bilaterally.  Dermatologic Incision well-healed not hypertrophic  Orthopedic: Little to no pain to palpation, limited range of motion first and second MTPJ   Multiple view plain film radiographs: Correction maintained no change in alignment of hardware, good consolidation across dorsal portions of Lapidus fusion, second toe site minimal consolidation Assessment:   1. Hallux valgus with bunions, right   2. Hammertoe of right foot    Plan:  Patient was evaluated and treated and all questions answered.  S/p foot surgery right -Overall doing well, does not have much consolidation across second toe site, we discussed likely not to be symptomatic.  Kirschner wires removed uneventfully.  She may return to regular shoe gear in 2 weeks.  Begin PT for range of motion.  I will see her back in 6 weeks.  Referral for PT sent.1  Return in about 6 weeks (around 12/30/2022) for post op (new x-rays).

## 2022-11-22 NOTE — Telephone Encounter (Signed)
Referral was sent over today. 

## 2022-11-29 DIAGNOSIS — M2011 Hallux valgus (acquired), right foot: Secondary | ICD-10-CM | POA: Diagnosis not present

## 2022-11-29 DIAGNOSIS — M62571 Muscle wasting and atrophy, not elsewhere classified, right ankle and foot: Secondary | ICD-10-CM | POA: Diagnosis not present

## 2022-11-29 DIAGNOSIS — M21611 Bunion of right foot: Secondary | ICD-10-CM | POA: Diagnosis not present

## 2022-11-29 DIAGNOSIS — M25674 Stiffness of right foot, not elsewhere classified: Secondary | ICD-10-CM | POA: Diagnosis not present

## 2022-12-01 DIAGNOSIS — M25674 Stiffness of right foot, not elsewhere classified: Secondary | ICD-10-CM | POA: Diagnosis not present

## 2022-12-01 DIAGNOSIS — M62571 Muscle wasting and atrophy, not elsewhere classified, right ankle and foot: Secondary | ICD-10-CM | POA: Diagnosis not present

## 2022-12-01 DIAGNOSIS — M2011 Hallux valgus (acquired), right foot: Secondary | ICD-10-CM | POA: Diagnosis not present

## 2022-12-01 DIAGNOSIS — M21611 Bunion of right foot: Secondary | ICD-10-CM | POA: Diagnosis not present

## 2022-12-07 DIAGNOSIS — M25674 Stiffness of right foot, not elsewhere classified: Secondary | ICD-10-CM | POA: Diagnosis not present

## 2022-12-07 DIAGNOSIS — M21611 Bunion of right foot: Secondary | ICD-10-CM | POA: Diagnosis not present

## 2022-12-07 DIAGNOSIS — M2011 Hallux valgus (acquired), right foot: Secondary | ICD-10-CM | POA: Diagnosis not present

## 2022-12-07 DIAGNOSIS — M62571 Muscle wasting and atrophy, not elsewhere classified, right ankle and foot: Secondary | ICD-10-CM | POA: Diagnosis not present

## 2022-12-08 ENCOUNTER — Telehealth: Payer: Self-pay | Admitting: Podiatry

## 2022-12-08 NOTE — Telephone Encounter (Signed)
Patient called and lvm stating that the last time she was here she got a compression sock, the top of sock has a band on it. Its making her leg swell since she has pretty big legs. She wanted to know if she can get some compression hosiery at a local shop  Please advise

## 2022-12-09 ENCOUNTER — Ambulatory Visit (HOSPITAL_COMMUNITY): Payer: PPO

## 2022-12-09 DIAGNOSIS — M62571 Muscle wasting and atrophy, not elsewhere classified, right ankle and foot: Secondary | ICD-10-CM | POA: Diagnosis not present

## 2022-12-09 DIAGNOSIS — M21611 Bunion of right foot: Secondary | ICD-10-CM | POA: Diagnosis not present

## 2022-12-09 DIAGNOSIS — M25674 Stiffness of right foot, not elsewhere classified: Secondary | ICD-10-CM | POA: Diagnosis not present

## 2022-12-09 DIAGNOSIS — M2011 Hallux valgus (acquired), right foot: Secondary | ICD-10-CM | POA: Diagnosis not present

## 2022-12-10 DIAGNOSIS — Z79899 Other long term (current) drug therapy: Secondary | ICD-10-CM | POA: Diagnosis not present

## 2022-12-10 DIAGNOSIS — D7589 Other specified diseases of blood and blood-forming organs: Secondary | ICD-10-CM | POA: Diagnosis not present

## 2022-12-10 DIAGNOSIS — E785 Hyperlipidemia, unspecified: Secondary | ICD-10-CM | POA: Diagnosis not present

## 2022-12-11 LAB — CMP14+EGFR
ALT: 22 IU/L (ref 0–32)
AST: 32 IU/L (ref 0–40)
Albumin/Globulin Ratio: 2 (ref 1.2–2.2)
Albumin: 4.7 g/dL (ref 3.9–4.9)
Alkaline Phosphatase: 90 IU/L (ref 44–121)
BUN/Creatinine Ratio: 13 (ref 12–28)
BUN: 9 mg/dL (ref 8–27)
Bilirubin Total: 0.4 mg/dL (ref 0.0–1.2)
CO2: 24 mmol/L (ref 20–29)
Calcium: 9.4 mg/dL (ref 8.7–10.3)
Chloride: 99 mmol/L (ref 96–106)
Creatinine, Ser: 0.67 mg/dL (ref 0.57–1.00)
Globulin, Total: 2.4 g/dL (ref 1.5–4.5)
Glucose: 96 mg/dL (ref 70–99)
Potassium: 3.9 mmol/L (ref 3.5–5.2)
Sodium: 138 mmol/L (ref 134–144)
Total Protein: 7.1 g/dL (ref 6.0–8.5)
eGFR: 95 mL/min/{1.73_m2} (ref >59)

## 2022-12-11 LAB — CBC
Hematocrit: 37.7 % (ref 34.0–46.6)
Hemoglobin: 13 g/dL (ref 11.1–15.9)
MCH: 32.9 pg (ref 26.6–33.0)
MCHC: 34.5 g/dL (ref 31.5–35.7)
MCV: 95 fL (ref 79–97)
Platelets: 199 10*3/uL (ref 150–450)
RBC: 3.95 x10E6/uL (ref 3.77–5.28)
RDW: 13.2 % (ref 11.7–15.4)
WBC: 5.1 10*3/uL (ref 3.4–10.8)

## 2022-12-11 LAB — VITAMIN B12: Vitamin B-12: 1209 pg/mL (ref 232–1245)

## 2022-12-11 LAB — LIPID PANEL
Chol/HDL Ratio: 1.8 ratio (ref 0.0–4.4)
Cholesterol, Total: 147 mg/dL (ref 100–199)
HDL: 84 mg/dL (ref >39)
LDL Chol Calc (NIH): 52 mg/dL (ref 0–99)
Triglycerides: 47 mg/dL (ref 0–149)
VLDL Cholesterol Cal: 11 mg/dL (ref 5–40)

## 2022-12-11 LAB — FOLATE: Folate: 19.8 ng/mL (ref >3.0)

## 2022-12-13 DIAGNOSIS — M25674 Stiffness of right foot, not elsewhere classified: Secondary | ICD-10-CM | POA: Diagnosis not present

## 2022-12-13 DIAGNOSIS — M2011 Hallux valgus (acquired), right foot: Secondary | ICD-10-CM | POA: Diagnosis not present

## 2022-12-13 DIAGNOSIS — M62571 Muscle wasting and atrophy, not elsewhere classified, right ankle and foot: Secondary | ICD-10-CM | POA: Diagnosis not present

## 2022-12-13 DIAGNOSIS — M21611 Bunion of right foot: Secondary | ICD-10-CM | POA: Diagnosis not present

## 2022-12-15 ENCOUNTER — Encounter: Payer: Self-pay | Admitting: Family Medicine

## 2022-12-16 ENCOUNTER — Other Ambulatory Visit: Payer: Self-pay | Admitting: Family Medicine

## 2022-12-16 DIAGNOSIS — M25674 Stiffness of right foot, not elsewhere classified: Secondary | ICD-10-CM | POA: Diagnosis not present

## 2022-12-16 DIAGNOSIS — M2011 Hallux valgus (acquired), right foot: Secondary | ICD-10-CM | POA: Diagnosis not present

## 2022-12-16 DIAGNOSIS — M62571 Muscle wasting and atrophy, not elsewhere classified, right ankle and foot: Secondary | ICD-10-CM | POA: Diagnosis not present

## 2022-12-16 DIAGNOSIS — M21611 Bunion of right foot: Secondary | ICD-10-CM | POA: Diagnosis not present

## 2022-12-16 MED ORDER — CLONAZEPAM 0.5 MG PO TABS: 0.5000 mg | ORAL_TABLET | Freq: Every evening | ORAL | 3 refills | Status: DC | PRN

## 2022-12-24 DIAGNOSIS — M62571 Muscle wasting and atrophy, not elsewhere classified, right ankle and foot: Secondary | ICD-10-CM | POA: Diagnosis not present

## 2022-12-24 DIAGNOSIS — M25674 Stiffness of right foot, not elsewhere classified: Secondary | ICD-10-CM | POA: Diagnosis not present

## 2022-12-24 DIAGNOSIS — M21611 Bunion of right foot: Secondary | ICD-10-CM | POA: Diagnosis not present

## 2022-12-24 DIAGNOSIS — M2011 Hallux valgus (acquired), right foot: Secondary | ICD-10-CM | POA: Diagnosis not present

## 2022-12-27 DIAGNOSIS — M62571 Muscle wasting and atrophy, not elsewhere classified, right ankle and foot: Secondary | ICD-10-CM | POA: Diagnosis not present

## 2022-12-27 DIAGNOSIS — M25674 Stiffness of right foot, not elsewhere classified: Secondary | ICD-10-CM | POA: Diagnosis not present

## 2022-12-27 DIAGNOSIS — M21611 Bunion of right foot: Secondary | ICD-10-CM | POA: Diagnosis not present

## 2022-12-27 DIAGNOSIS — M2011 Hallux valgus (acquired), right foot: Secondary | ICD-10-CM | POA: Diagnosis not present

## 2022-12-28 ENCOUNTER — Ambulatory Visit
Admission: RE | Admit: 2022-12-28 | Discharge: 2022-12-28 | Disposition: A | Discharge status: Home / Self Care | Payer: PPO | Source: Ambulatory Visit | Attending: Obstetrics and Gynecology | Admitting: Obstetrics and Gynecology

## 2022-12-28 DIAGNOSIS — Z13820 Encounter for screening for osteoporosis: Secondary | ICD-10-CM | POA: Diagnosis not present

## 2022-12-28 DIAGNOSIS — E2839 Other primary ovarian failure: Secondary | ICD-10-CM

## 2022-12-30 ENCOUNTER — Ambulatory Visit: Payer: PPO

## 2022-12-30 ENCOUNTER — Ambulatory Visit (INDEPENDENT_AMBULATORY_CARE_PROVIDER_SITE_OTHER): Payer: PPO | Admitting: Podiatry

## 2022-12-30 DIAGNOSIS — M2011 Hallux valgus (acquired), right foot: Secondary | ICD-10-CM

## 2022-12-30 DIAGNOSIS — M21611 Bunion of right foot: Secondary | ICD-10-CM | POA: Diagnosis not present

## 2022-12-30 NOTE — Progress Notes (Signed)
  Subjective:  Patient ID: Regina Neal, female    DOB: [birth year],  MRN: [id number]  Chief Complaint  Patient presents with   Post-op Problem    POV #4 DOS 10/08/2022 (new x-rays) LAPIDUS BUNIONECTOMY OF RT FOOT, POSS WEIL OSTEOTOMY OF 2ND METATARSAL, POSS AKIN OSTEOTOMY, 2ND HAMMERTOE CORRECTION    67 y.o. female returns for post-op check.  She is doing well therapy has been very helpful  Review of Systems: Negative except as noted in the HPI. Denies N/V/F/Ch.   Objective:  There were no vitals filed for this visit. There is no height or weight on file to calculate BMI. Constitutional Well developed. Well nourished.  Vascular Foot warm and well perfused. Capillary refill normal to all digits.  Calf is soft and supple, no posterior calf or knee pain, negative Homans' sign  Neurologic Normal speech. Oriented to person, place, and time. Epicritic sensation to light touch grossly present bilaterally.  Dermatologic Incision well-healed not hypertrophic  Orthopedic: No pain to palpate, good range of motion of both toes   Multiple view plain film radiographs: Correction maintained no change in alignment of hardware, good consolidation across entirety of fusion site and persistent lucency at the second PIPJ arthrodesis site Assessment:   1. Hallux valgus with bunions, right    Plan:  Patient was evaluated and treated and all questions answered.  S/p foot surgery right -Overall doing well, may continue in regular shoe gear and activity.  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  Should not give long-term issues.  Return to me as needed if there are further issues with this foot or new issues develop.  I have no further restrictions for her at this point.  May continue PT until she is satisfied with her progress. Return if symptoms worsen or fail to improve.

## 2022-12-31 DIAGNOSIS — M25674 Stiffness of right foot, not elsewhere classified: Secondary | ICD-10-CM | POA: Diagnosis not present

## 2022-12-31 DIAGNOSIS — M21611 Bunion of right foot: Secondary | ICD-10-CM | POA: Diagnosis not present

## 2022-12-31 DIAGNOSIS — M2011 Hallux valgus (acquired), right foot: Secondary | ICD-10-CM | POA: Diagnosis not present

## 2022-12-31 DIAGNOSIS — M62571 Muscle wasting and atrophy, not elsewhere classified, right ankle and foot: Secondary | ICD-10-CM | POA: Diagnosis not present

## 2023-01-05 DIAGNOSIS — M5481 Occipital neuralgia: Secondary | ICD-10-CM | POA: Diagnosis not present

## 2023-01-05 DIAGNOSIS — G500 Trigeminal neuralgia: Secondary | ICD-10-CM | POA: Diagnosis not present

## 2023-01-11 ENCOUNTER — Ambulatory Visit: Payer: PPO | Admitting: Family Medicine

## 2023-01-17 DIAGNOSIS — M62571 Muscle wasting and atrophy, not elsewhere classified, right ankle and foot: Secondary | ICD-10-CM | POA: Diagnosis not present

## 2023-01-17 DIAGNOSIS — M2011 Hallux valgus (acquired), right foot: Secondary | ICD-10-CM | POA: Diagnosis not present

## 2023-01-17 DIAGNOSIS — M25674 Stiffness of right foot, not elsewhere classified: Secondary | ICD-10-CM | POA: Diagnosis not present

## 2023-01-17 DIAGNOSIS — M21611 Bunion of right foot: Secondary | ICD-10-CM | POA: Diagnosis not present

## 2023-01-18 ENCOUNTER — Encounter: Payer: Self-pay | Admitting: Family Medicine

## 2023-01-18 ENCOUNTER — Ambulatory Visit (INDEPENDENT_AMBULATORY_CARE_PROVIDER_SITE_OTHER): Payer: PPO | Admitting: Family Medicine

## 2023-01-18 VITALS — BP 114/67 | HR 80 | Temp 97.9°F | Ht 60.0 in | Wt 114.0 lb

## 2023-01-18 DIAGNOSIS — I10 Essential (primary) hypertension: Secondary | ICD-10-CM

## 2023-01-18 DIAGNOSIS — E785 Hyperlipidemia, unspecified: Secondary | ICD-10-CM

## 2023-01-18 DIAGNOSIS — K219 Gastro-esophageal reflux disease without esophagitis: Secondary | ICD-10-CM | POA: Diagnosis not present

## 2023-01-18 NOTE — Assessment & Plan Note (Signed)
Stable. °-Continue amlodipine °

## 2023-01-18 NOTE — Assessment & Plan Note (Signed)
Controlled.  Continue Crestor.

## 2023-01-18 NOTE — Progress Notes (Signed)
Subjective:  Patient ID: Regina Neal, female    DOB: [birth year]  Age: 67 y.o. MRN: [id number]  CC: Chief Complaint  Patient presents with   Hypertension    HPI:  67 year old female presents for follow-up.  Hypertension is well-controlled on amlodipine.  GERD is stable on omeprazole.  Hyperlipidemia is well-controlled on Crestor.  Patient's recent labs reviewed.  Labs all within normal limits.  LDL 52.  Patient states that overall she is doing well.  She is slightly nervous about having cataract surgery tomorrow.  Otherwise that she is feeling well.  Patient Active Problem List   Diagnosis Date Noted   Migraine 07/28/2022   Hyperlipidemia 07/28/2022   Insomnia 07/28/2022   Osteopenia 07/28/2022   GERD (gastroesophageal reflux disease) 07/28/2022   Trigeminal neuralgia 07/27/2022   Cerebellar infarct ([facility]) 07/27/2022   Facet hypertrophy of cervical region 04/21/2022   Occipital neuralgia of right side 06/02/2021   Essential hypertension 01/05/2016   Pain syndrome, chronic 02/16/2013    Social Hx   Social History   Socioeconomic History   Marital status: Legally Separated    Spouse name: Not on file   Number of children: 2   Years of education: Not on file   Highest education level: 12th grade  Occupational History   Not on file  Tobacco Use   Smoking status: Never   Smokeless tobacco: Never  Vaping Use   Vaping Use: Never used  Substance and Sexual Activity   Alcohol use: Never   Drug use: Never   Sexual activity: Not on file  Other Topics Concern   Not on file  Social History Narrative   Lives alone   Right handed   Caffeine: 2 cups of coffee and 1 glass of tea, and half a soda a day   Social Determinants of Health   Financial Resource Strain: Low Risk  (01/15/2023)   Overall Financial Resource Strain (CARDIA)    Difficulty of Paying Living Expenses: Not hard at all  Food Insecurity: No Food Insecurity (01/15/2023)   Hunger Vital Sign     Worried About Running Out of Food in the Last Year: Never true    Ran Out of Food in the Last Year: Never true  Transportation Needs: No Transportation Needs (01/15/2023)   PRAPARE - Administrator, Civil Service (Medical): No    Lack of Transportation (Non-Medical): No  Physical Activity: Sufficiently Active (01/15/2023)   Exercise Vital Sign    Days of Exercise per Week: 3 days    Minutes of Exercise per Session: 50 min  Stress: No Stress Concern Present (01/15/2023)   [employer] of Occupational Health - Occupational Stress Questionnaire    Feeling of Stress : Only a little  Social Connections: Moderately Integrated (01/15/2023)   Social Connection and Isolation Panel [NHANES]    Frequency of Communication with Friends and Family: More than three times a week    Frequency of Social Gatherings with Friends and Family: Twice a week    Attends Religious Services: 1 to 4 times per year    Active Member of [employer] or Organizations: Yes    Attends Engineer, structural: More than 4 times per year    Marital Status: Divorced    Review of Systems  Constitutional: Negative.   Respiratory: Negative.    Cardiovascular: Negative.     Objective:  BP 114/67   Pulse 80   Temp 97.9 F (36.6 C)   Ht 5' (1.524 m)  Wt 114 lb (51.7 kg)   SpO2 97%   BMI 22.26 kg/m      01/18/2023    1:02 PM 10/26/2022    2:17 PM 09/21/2022   11:23 AM  BP/Weight  Systolic BP 114 134 109  Diastolic BP 67 71 68  Wt. (Lbs) 114  115.2  BMI 22.26 kg/m2  22.5 kg/m2    Physical Exam Vitals and nursing note reviewed.  Constitutional:      General: She is not in acute distress.    Appearance: Normal appearance.  HENT:     Head: Normocephalic and atraumatic.  Eyes:     General:        Right eye: No discharge.        Left eye: No discharge.     Conjunctiva/sclera: Conjunctivae normal.  Cardiovascular:     Rate and Rhythm: Normal rate and regular rhythm.  Pulmonary:     Effort:  Pulmonary effort is normal.     Breath sounds: Normal breath sounds. No wheezing, rhonchi or rales.  Neurological:     Mental Status: She is alert.  Psychiatric:        Mood and Affect: Mood normal.        Behavior: Behavior normal.     Lab Results  Component Value Date   WBC 5.1 12/10/2022   HGB 13.0 12/10/2022   HCT 37.7 12/10/2022   PLT 199 12/10/2022   GLUCOSE 96 12/10/2022   CHOL 147 12/10/2022   TRIG 47 12/10/2022   HDL 84 12/10/2022   LDLCALC 52 12/10/2022   ALT 22 12/10/2022   AST 32 12/10/2022   NA 138 12/10/2022   K 3.9 12/10/2022   CL 99 12/10/2022   CREATININE 0.67 12/10/2022   BUN 9 12/10/2022   CO2 24 12/10/2022     Assessment & Plan:   Problem List Items Addressed This Visit       Cardiovascular and Mediastinum   Essential hypertension - Primary    Stable.  Continue amlodipine.        Digestive   GERD (gastroesophageal reflux disease)    Continue omeprazole.        Other   Hyperlipidemia    Controlled.  Continue Crestor.      Follow-up:  Return in about 1 year (around 01/18/2024).  Everlene Other DO [facility]

## 2023-01-18 NOTE — Patient Instructions (Signed)
Continue your medications.  Follow up annually.  Good luck on your surgery.  Take care  Dr. Adriana Simas

## 2023-01-18 NOTE — Assessment & Plan Note (Signed)
Continue omeprazole 

## 2023-01-19 DIAGNOSIS — H2511 Age-related nuclear cataract, right eye: Secondary | ICD-10-CM | POA: Diagnosis not present

## 2023-01-20 DIAGNOSIS — H2512 Age-related nuclear cataract, left eye: Secondary | ICD-10-CM | POA: Diagnosis not present

## 2023-02-17 ENCOUNTER — Encounter: Payer: Self-pay | Admitting: Family Medicine

## 2023-02-17 ENCOUNTER — Other Ambulatory Visit: Payer: Self-pay | Admitting: Family Medicine

## 2023-02-22 ENCOUNTER — Encounter: Payer: Self-pay | Admitting: Neurology

## 2023-02-23 ENCOUNTER — Other Ambulatory Visit: Payer: Self-pay | Admitting: *Deleted

## 2023-02-23 MED ORDER — GABAPENTIN 600 MG PO TABS: 600.0000 mg | ORAL_TABLET | Freq: Four times a day (QID) | ORAL | 1 refills | Status: AC

## 2023-02-24 DIAGNOSIS — M810 Age-related osteoporosis without current pathological fracture: Secondary | ICD-10-CM | POA: Diagnosis not present

## 2023-02-25 ENCOUNTER — Ambulatory Visit (INDEPENDENT_AMBULATORY_CARE_PROVIDER_SITE_OTHER): Payer: PPO

## 2023-02-25 VITALS — Ht 60.0 in | Wt 113.0 lb

## 2023-02-25 DIAGNOSIS — Z0000 Encounter for general adult medical examination without abnormal findings: Secondary | ICD-10-CM

## 2023-02-25 NOTE — Patient Instructions (Signed)
[name] , Thank you for taking time to come for your Medicare Wellness Visit. I appreciate your ongoing commitment to your health goals. Please review the following plan we discussed and let me know if I can assist you in the future.   These are the goals we discussed:  Goals      Patient Stated     Patient states she had bunion surgery and she would like to get back to per pre-surgery state.         This is a list of the screening recommended for you and due dates:  Health Maintenance  Topic Date Due   Zoster (Shingles) Vaccine (1 of 2) Never done   Pneumonia Vaccine (2 of 2 - PCV) 09/24/2021   DTaP/Tdap/Td vaccine (2 - Td or Tdap) 06/08/2022   COVID-19 Vaccine (6 - 2023-24 season) 12/29/2023*   Flu Shot  03/31/2023   Colon Cancer Screening  09/04/2023   Medicare Annual Wellness Visit  02/25/2024   Mammogram  07/08/2024   DEXA scan (bone density measurement)  Completed   Hepatitis C Screening  Completed   HPV Vaccine  Aged Out  *Topic was postponed. The date shown is not the original due date.    Advanced directives: Advance directive discussed with you today. Even though you declined this today, please call our office should you change your mind, and we can give you the proper paperwork for you to fill out. Advance care planning is a way to make decisions about medical care that fits your values in case you are ever unable to make these decisions for yourself.  Information on Advanced Care Planning can be found at [facility] Advance Health Care Directives Advance Health Care Directives ([email or web address]    Conditions/risks identified:  You are due for the vaccines checked below. You may have these done at your preferred pharmacy. Please have them fax the office proof of the vaccines so that we can update your chart.   [x]  Shingrix (Shingles vaccine) []  Pneumonia Vaccines [x]  TDAP (Tetanus) Vaccine []  Covid-19   Next appointment: VIRTUAL/TELEPHONE  APPOINTMENT Follow up in one year for your annual wellness visit  March 09, 2024 at 1pm telephone visit   Preventive Care 65 Years and Older, Female Preventive care refers to lifestyle choices and visits with your health care provider that can promote health and wellness. What does preventive care include? A yearly physical exam. This is also called an annual well check. Dental exams once or twice a year. Routine eye exams. Ask your health care provider how often you should have your eyes checked. Personal lifestyle choices, including: Daily care of your teeth and gums. Regular physical activity. Eating a healthy diet. Avoiding tobacco and drug use. Limiting alcohol use. Practicing safe sex. Taking low-dose aspirin every day. Taking vitamin and mineral supplements as recommended by your health care provider. What happens during an annual well check? The services and screenings done by your health care provider during your annual well check will depend on your age, overall health, lifestyle risk factors, and family history of disease. Counseling  Your health care provider may ask you questions about your: Alcohol use. Tobacco use. Drug use. Emotional well-being. Home and relationship well-being. Sexual activity. Eating habits. History of falls. Memory and ability to understand (cognition). Work and work Astronomer. Reproductive health. Screening  You may have the following tests or measurements: Height, weight, and BMI. Blood pressure. Lipid and cholesterol levels. These may be checked every  5 years, or more frequently if you are over 49 years old. Skin check. Lung cancer screening. You may have this screening every year starting at age 71 if you have a 30-pack-year history of smoking and currently smoke or have quit within the past 15 years. Fecal occult blood test (FOBT) of the stool. You may have this test every year starting at age 69. Flexible sigmoidoscopy or  colonoscopy. You may have a sigmoidoscopy every 5 years or a colonoscopy every 10 years starting at age 16. Hepatitis C blood test. Hepatitis B blood test. Sexually transmitted disease (STD) testing. Diabetes screening. This is done by checking your blood sugar (glucose) after you have not eaten for a while (fasting). You may have this done every 1-3 years. Bone density scan. This is done to screen for osteoporosis. You may have this done starting at age 87. Mammogram. This may be done every 1-2 years. Talk to your health care provider about how often you should have regular mammograms. Talk with your health care provider about your test results, treatment options, and if necessary, the need for more tests. Vaccines  Your health care provider may recommend certain vaccines, such as: Influenza vaccine. This is recommended every year. Tetanus, diphtheria, and acellular pertussis (Tdap, Td) vaccine. You may need a Td booster every 10 years. Zoster vaccine. You may need this after age 25. Pneumococcal 13-valent conjugate (PCV13) vaccine. One dose is recommended after age 71. Pneumococcal polysaccharide (PPSV23) vaccine. One dose is recommended after age 21. Talk to your health care provider about which screenings and vaccines you need and how often you need them. This information is not intended to replace advice given to you by your health care provider. Make sure you discuss any questions you have with your health care provider. Document Released: 09/12/2015 Document Revised: 05/05/2016 Document Reviewed: 06/17/2015 Elsevier Interactive Patient Education  2017 [employer] Prevention in the Home Falls can cause injuries. They can happen to people of all ages. There are many things you can do to make your home safe and to help prevent falls. What can I do on the outside of my home? Regularly fix the edges of walkways and driveways and fix any cracks. Remove anything that might make you  trip as you walk through a door, such as a raised step or threshold. Trim any bushes or trees on the path to your home. Use bright outdoor lighting. Clear any walking paths of anything that might make someone trip, such as rocks or tools. Regularly check to see if handrails are loose or broken. Make sure that both sides of any steps have handrails. Any raised decks and porches should have guardrails on the edges. Have any leaves, snow, or ice cleared regularly. Use sand or salt on walking paths during winter. Clean up any spills in your garage right away. This includes oil or grease spills. What can I do in the bathroom? Use night lights. Install grab bars by the toilet and in the tub and shower. Do not use towel bars as grab bars. Use non-skid mats or decals in the tub or shower. If you need to sit down in the shower, use a plastic, non-slip stool. Keep the floor dry. Clean up any water that spills on the floor as soon as it happens. Remove soap buildup in the tub or shower regularly. Attach bath mats securely with double-sided non-slip rug tape. Do not have throw rugs and other things on the floor that can make  you trip. What can I do in the bedroom? Use night lights. Make sure that you have a light by your bed that is easy to reach. Do not use any sheets or blankets that are too big for your bed. They should not hang down onto the floor. Have a firm chair that has side arms. You can use this for support while you get dressed. Do not have throw rugs and other things on the floor that can make you trip. What can I do in the kitchen? Clean up any spills right away. Avoid walking on wet floors. Keep items that you use a lot in easy-to-reach places. If you need to reach something above you, use a strong step stool that has a grab bar. Keep electrical cords out of the way. Do not use floor polish or wax that makes floors slippery. If you must use wax, use non-skid floor wax. Do not have  throw rugs and other things on the floor that can make you trip. What can I do with my stairs? Do not leave any items on the stairs. Make sure that there are handrails on both sides of the stairs and use them. Fix handrails that are broken or loose. Make sure that handrails are as long as the stairways. Check any carpeting to make sure that it is firmly attached to the stairs. Fix any carpet that is loose or worn. Avoid having throw rugs at the top or bottom of the stairs. If you do have throw rugs, attach them to the floor with carpet tape. Make sure that you have a light switch at the top of the stairs and the bottom of the stairs. If you do not have them, ask someone to add them for you. What else can I do to help prevent falls? Wear shoes that: Do not have high heels. Have rubber bottoms. Are comfortable and fit you well. Are closed at the toe. Do not wear sandals. If you use a stepladder: Make sure that it is fully opened. Do not climb a closed stepladder. Make sure that both sides of the stepladder are locked into place. Ask someone to hold it for you, if possible. Clearly mark and make sure that you can see: Any grab bars or handrails. First and last steps. Where the edge of each step is. Use tools that help you move around (mobility aids) if they are needed. These include: Canes. Walkers. Scooters. Crutches. Turn on the lights when you go into a dark area. Replace any light bulbs as soon as they burn out. Set up your furniture so you have a clear path. Avoid moving your furniture around. If any of your floors are uneven, fix them. If there are any pets around you, be aware of where they are. Review your medicines with your doctor. Some medicines can make you feel dizzy. This can increase your chance of falling. Ask your doctor what other things that you can do to help prevent falls. This information is not intended to replace advice given to you by your health care provider.  Make sure you discuss any questions you have with your health care provider. Document Released: 06/12/2009 Document Revised: 01/22/2016 Document Reviewed: 09/20/2014 Elsevier Interactive Patient Education  2017 [employer].

## 2023-02-25 NOTE — Progress Notes (Signed)
 Subjective:   Regina Neal is a 67 y.o. female who presents for an Initial Medicare Annual Wellness Visit.  Visit Complete: Virtual  I connected with  Regina Neal on 02/25/23 by a audio enabled telemedicine application and verified that I am speaking with the correct person using two identifiers.  Patient Location: Home  Provider Location: Home Office  I discussed the limitations of evaluation and management by telemedicine. The patient expressed understanding and agreed to proceed.  Patient Medicare AWV questionnaire was completed by the patient on 02/22/23; I have confirmed that all information answered by patient is correct and no changes since this date.  Review of Systems     Cardiac Risk Factors include: advanced age (>55men, >67 women);dyslipidemia;hypertension;sedentary lifestyle     Objective:    Today's Vitals   02/25/23 1435 02/25/23 1436  Weight: 113 lb (51.3 kg)   Height: 5' (1.524 m)   PainSc:  5    Body mass index is 22.07 kg/m.     02/25/2023    2:35 PM 04/14/2021    2:58 PM  Advanced Directives  Does Patient Have a Medical Advance Directive? No Yes  Does patient want to make changes to medical advance directive?  No - Patient declined  Would patient like information on creating a medical advance directive? No - Patient declined     Current Medications (verified) Outpatient Encounter Medications as of 02/25/2023  Medication Sig   amLODipine (NORVASC) 10 MG tablet Take 1 tablet (10 mg total) by mouth daily.   baclofen (LIORESAL) 20 MG tablet Take 1 tablet (20 mg total) by mouth 3 (three) times daily.   clonazePAM (KLONOPIN) 0.5 MG tablet Take 1 tablet (0.5 mg total) by mouth at bedtime as needed.   DULoxetine (CYMBALTA) 20 MG capsule Take 1 capsule (20 mg total) by mouth every morning.   gabapentin (NEURONTIN) 600 MG tablet Take 1 tablet (600 mg total) by mouth 4 (four) times daily.   omeprazole (PRILOSEC) 20 MG capsule Take 1 capsule (20 mg  total) by mouth daily.   rizatriptan (MAXALT) 10 MG tablet Take 1 tablet (10 mg total) by mouth as needed for migraine. May repeat in 2 hours if needed   rosuvastatin (CRESTOR) 20 MG tablet Take by mouth.   Ascorbic Acid (VITAMIN C) 500 MG CAPS Take 2 capsules by mouth every other day.   aspirin 81 MG chewable tablet Chew by mouth daily.   calcium carbonate (OSCAL) 1500 (600 Ca) MG TABS tablet Take by mouth daily with breakfast.   cholecalciferol (VITAMIN D) 1000 units tablet Take 2,000 Units by mouth daily.   cyclobenzaprine (FLEXERIL) 5 MG tablet Take 1 tablet (5 mg total) by mouth every 8 (eight) hours as needed for muscle spasms. (Patient not taking: Reported on 02/25/2023)   Multiple Vitamin (MULTI-VITAMIN DAILY) TABS Take 1 tablet by mouth every other day.   NON FORMULARY Apply 1 patch topically as needed. Lidoderm patch   Omega-3 Fatty Acids (FISH OIL OMEGA-3 PO) Take 1 tablet by mouth every other day.   vitamin E 180 MG (400 UNITS) capsule Take 1 capsule by mouth every other day.   Facility-Administered Encounter Medications as of 02/25/2023  Medication   triamcinolone acetonide (KENALOG-40) injection 10 mg    Allergies (verified) Buprenorphine and Verapamil   History: Past Medical History:  Diagnosis Date   Anxiety    Asthma    exercised induced   Cataract    Hypertension    Multiple sclerosis (HCC)  Stroke [facility])    Trigeminal neuralgia    Past Surgical History:  Procedure Laterality Date   INSERTION / PLACEMENT / REVISION NEUROSTIMULATOR Right 2015   TUBAL LIGATION     Family History  Problem Relation Age of Onset   Cancer Mother    Heart disease Mother    Lung cancer Father    Cancer Father    Stroke Maternal Grandmother    Social History   Socioeconomic History   Marital status: Legally Separated    Spouse name: Not on file   Number of children: 2   Years of education: Not on file   Highest education level: 12th grade  Occupational History   Not on  file  Tobacco Use   Smoking status: Never   Smokeless tobacco: Never  Vaping Use   Vaping Use: Never used  Substance and Sexual Activity   Alcohol use: Never   Drug use: Never   Sexual activity: Not Currently    Birth control/protection: Surgical  Other Topics Concern   Not on file  Social History Narrative   Lives alone   Right handed   Caffeine: 2 cups of coffee and 1 glass of tea, and half a soda a day   Social Determinants of Health   Financial Resource Strain: Low Risk  (02/25/2023)   Overall Financial Resource Strain (CARDIA)    Difficulty of Paying Living Expenses: Not hard at all  Food Insecurity: No Food Insecurity (02/25/2023)   Hunger Vital Sign    Worried About Running Out of Food in the Last Year: Never true    Ran Out of Food in the Last Year: Never true  Transportation Needs: No Transportation Needs (02/25/2023)   PRAPARE - Administrator, Civil Service (Medical): No    Lack of Transportation (Non-Medical): No  Physical Activity: Insufficiently Active (02/25/2023)   Exercise Vital Sign    Days of Exercise per Week: 3 days    Minutes of Exercise per Session: 30 min  Stress: No Stress Concern Present (02/25/2023)   [employer] of Occupational Health - Occupational Stress Questionnaire    Feeling of Stress : Only a little  Social Connections: Moderately Integrated (02/25/2023)   Social Connection and Isolation Panel [NHANES]    Frequency of Communication with Friends and Family: More than three times a week    Frequency of Social Gatherings with Friends and Family: Three times a week    Attends Religious Services: 1 to 4 times per year    Active Member of Clubs or Organizations: Yes    Attends Engineer, structural: More than 4 times per year    Marital Status: Separated    Tobacco Counseling Counseling given: Yes   Clinical Intake:  Pre-visit preparation completed: Yes  Pain : 0-10 Pain Score: 5  Pain Type: Neuropathic  pain Pain Location: Other (Comment) (trigimenal neuropathy) Pain Orientation: Other (Comment) Pain Descriptors / Indicators: Burning, Throbbing, Sharp Pain Onset: More than a month ago Pain Frequency: Constant     BMI - recorded: 22.07 Nutritional Status: BMI of 19-24  Normal Nutritional Risks: None Diabetes: No  How often do you need to have someone help you when you read instructions, pamphlets, or other written materials from your doctor or pharmacy?: 1 - Never  Interpreter Needed?: No  Information entered by ::  Eldine Rencher, CMA   Activities of Daily Living    02/25/2023    2:43 PM 02/22/2023   10:39 AM  In your present  state of health, do you have any difficulty performing the following activities:  Hearing? 0 0  Vision? 0 0  Difficulty concentrating or making decisions? 0 0  Walking or climbing stairs? 0 0  Dressing or bathing? 0 0  Doing errands, shopping? 0 0  Preparing Food and eating ? N N  Using the Toilet? N N  In the past six months, have you accidently leaked urine? N N  Do you have problems with loss of bowel control? N N  Managing your Medications? N N  Managing your Finances? N N  Housekeeping or managing your Housekeeping? N N    Patient Care Team: Tommie Sams, DO as PCP - General (Family Medicine)  Indicate any recent Medical Services you may have received from other than [facility] providers in the past year (date may be approximate).     Assessment:   This is a routine wellness examination for Regina Neal.  Hearing/Vision screen Hearing Screening - Comments:: Patient denies any hearing difficulties.    Dietary issues and exercise activities discussed:     Goals Addressed             This Visit's Progress    Patient Stated       Patient states she had bunion surgery and she would like to get back to per pre-surgery state.        Depression Screen    02/25/2023    2:43 PM 09/21/2022   11:24 AM 07/27/2022    2:33 PM  PHQ 2/9 Scores   PHQ - 2 Score 0 0 1  PHQ- 9 Score   3    Fall Risk    02/25/2023    2:42 PM 02/22/2023   10:39 AM 09/21/2022   11:24 AM 07/27/2022    2:33 PM  Fall Risk   Falls in the past year? 0 0 0 0  Number falls in past yr: 0  0   Injury with Fall? 0  0   Risk for fall due to : No Fall Risks  No Fall Risks No Fall Risks  Follow up Falls prevention discussed  Falls evaluation completed Falls evaluation completed    MEDICARE RISK AT HOME:  Medicare Risk at Home - 02/25/23 1440     Any stairs in or around the home? Yes    If so, are there any without handrails? No    Home free of loose throw rugs in walkways, pet beds, electrical cords, etc? Yes    Adequate lighting in your home to reduce risk of falls? Yes    Life alert? No    Use of a cane, walker or w/c? No    Grab bars in the bathroom? No    Shower chair or bench in shower? No    Elevated toilet seat or a handicapped toilet? No             TIMED UP AND GO:  Was the test performed? No    Cognitive Function:        02/25/2023    2:40 PM  6CIT Screen  What Year? 0 points  What month? 0 points  What time? 0 points  Count back from 20 0 points  Months in reverse 0 points  Repeat phrase 0 points  Total Score 0 points    Immunizations Immunization History  Administered Date(s) Administered   Fluad Quad(high Dose 65+) 06/14/2019   Influenza, High Dose Seasonal PF 07/26/2022   Influenza-Unspecified 07/08/2015   Moderna  Sars-Covid-2 Vaccination 10/03/2019, 10/31/2019, 07/02/2020   Novel Infuenza-h1n1-09 08/27/2008   Pneumococcal Polysaccharide-23 02/09/2005, 09/24/2020   Td (Adult),5 Lf Tetanus Toxid, Preservative Free 02/09/2005   Tdap 06/08/2012   Unspecified SARS-COV-2 Vaccination 10/03/2019, 10/31/2019   Zoster, Live 01/07/2014    TDAP status: Due, Education has been provided regarding the importance of this vaccine. Advised may receive this vaccine at local pharmacy or Health Dept. Aware to provide a copy of the  vaccination record if obtained from local pharmacy or Health Dept. Verbalized acceptance and understanding.  Flu Vaccine status: Up to date  Pneumococcal vaccine status: Due, Education has been provided regarding the importance of this vaccine. Advised may receive this vaccine at local pharmacy or Health Dept. Aware to provide a copy of the vaccination record if obtained from local pharmacy or Health Dept. Verbalized acceptance and understanding.  Covid-19 vaccine status: Information provided on how to obtain vaccines.   Qualifies for Shingles Vaccine? Yes   Zostavax completed Yes   Shingrix Completed?: No.    Education has been provided regarding the importance of this vaccine. Patient has been advised to call insurance company to determine out of pocket expense if they have not yet received this vaccine. Advised may also receive vaccine at local pharmacy or Health Dept. Verbalized acceptance and understanding.  Screening Tests Health Maintenance  Topic Date Due   Zoster Vaccines- Shingrix (1 of 2) Never done   Pneumonia Vaccine 39+ Years old (2 of 2 - PCV) 09/24/2021   DTaP/Tdap/Td (2 - Td or Tdap) 06/08/2022   Medicare Annual Wellness (AWV)  09/30/2022   COVID-19 Vaccine (6 - 2023-24 season) 12/29/2023 (Originally 04/30/2022)   INFLUENZA VACCINE  03/31/2023   Colonoscopy  09/04/2023   MAMMOGRAM  07/08/2024   DEXA SCAN  Completed   Hepatitis C Screening  Completed   HPV VACCINES  Aged Out    Health Maintenance  Health Maintenance Due  Topic Date Due   Zoster Vaccines- Shingrix (1 of 2) Never done   Pneumonia Vaccine 40+ Years old (2 of 2 - PCV) 09/24/2021   DTaP/Tdap/Td (2 - Td or Tdap) 06/08/2022   Medicare Annual Wellness (AWV)  09/30/2022    Colorectal cancer screening: Type of screening: Colonoscopy. Completed 09/03/2013. Repeat every 10 years  Mammogram status: Completed 07/08/2022. Repeat every year  Bone Density status: Completed 12/28/2022. Results reflect: Bone density  results: OSTEOPOROSIS. Repeat every 2 years.  Lung Cancer Screening: (Low Dose CT Chest recommended if Age 53-80 years, 20 pack-year currently smoking OR have quit w/in 15years.) does not qualify.   Lung Cancer Screening Referral: na  Additional Screening:  Hepatitis C Screening: does not qualify; Completed 09/19/2020  Vision Screening: Recommended annual ophthalmology exams for early detection of glaucoma and other disorders of the eye. Is the patient up to date with their annual eye exam?  Yes  Who is the provider or what is the name of the office in which the patient attends annual eye exams? Dr. Charise Killian If pt is not established with a provider, would they like to be referred to a provider to establish care? No .   Dental Screening: Recommended annual dental exams for proper oral hygiene  Diabetic Foot Exam: na  Community Resource Referral / Chronic Care Management: CRR required this visit?  No   CCM required this visit?  No     Plan:     I have personally reviewed and noted the following in the patient's chart:   Medical and social history Use  of alcohol, tobacco or illicit drugs  Current medications and supplements including opioid prescriptions. Patient is not currently taking opioid prescriptions. Functional ability and status Nutritional status Physical activity Advanced directives List of other physicians Hospitalizations, surgeries, and ER visits in previous 12 months Vitals Screenings to include cognitive, depression, and falls Referrals and appointments  In addition, I have reviewed and discussed with patient certain preventive protocols, quality metrics, and best practice recommendations. A written personalized care plan for preventive services as well as general preventive health recommendations were provided to patient.   Because this visit was a virtual/telehealth visit,  certain criteria was not obtained, such a blood pressure, CBG if patient is a  diabetic, and timed up and go.  Any medications not marked as taking were not mentioned by the patient (or their caregiver if applicable) when reconciling the medications.    Jordan Hawks Tatum Corl, CMA   02/25/2023   After Visit Summary: (MyChart) Due to this being a telephonic visit, the after visit summary with patients personalized plan was offered to patient via MyChart   Nurse Notes:

## 2023-03-09 DIAGNOSIS — H2512 Age-related nuclear cataract, left eye: Secondary | ICD-10-CM | POA: Diagnosis not present

## 2023-03-22 ENCOUNTER — Telehealth: Payer: Self-pay | Admitting: Neurology

## 2023-03-22 ENCOUNTER — Other Ambulatory Visit: Payer: Self-pay

## 2023-03-22 ENCOUNTER — Encounter: Payer: Self-pay | Admitting: Family Medicine

## 2023-03-22 MED ORDER — ROSUVASTATIN CALCIUM 20 MG PO TABS: 20.0000 mg | ORAL_TABLET | Freq: Every day | ORAL | 1 refills | Status: DC

## 2023-03-22 NOTE — Telephone Encounter (Signed)
Returned call to pt who stated that her face is spasming around eye near eye lids and causing it to swell and even affecting vision. She said Dr. Daphine Deutscher retired from [facility] as neuro ophthalmologist but she took liberties to find one in [place] but hasn't got appt until 04/15/23. His name is andrew grose she needs Dr. Epimenio Foot to try to contact dr. Acquanetta Belling and see if he can pressure them into seeing her sooner.

## 2023-03-22 NOTE — Telephone Encounter (Signed)
Pt stated she needs to talk to nurse. Stated she is having eye spasm and she wants to see if doctors Sater can reach out to provider for sooner appointment.

## 2023-04-14 ENCOUNTER — Encounter: Payer: Self-pay | Admitting: Family Medicine

## 2023-04-14 DIAGNOSIS — M316 Other giant cell arteritis: Secondary | ICD-10-CM | POA: Diagnosis not present

## 2023-04-14 DIAGNOSIS — G500 Trigeminal neuralgia: Secondary | ICD-10-CM | POA: Diagnosis not present

## 2023-04-14 DIAGNOSIS — H5340 Unspecified visual field defects: Secondary | ICD-10-CM | POA: Diagnosis not present

## 2023-04-14 DIAGNOSIS — H47091 Other disorders of optic nerve, not elsewhere classified, right eye: Secondary | ICD-10-CM | POA: Diagnosis not present

## 2023-04-15 ENCOUNTER — Other Ambulatory Visit: Payer: Self-pay | Admitting: Family Medicine

## 2023-04-15 MED ORDER — CLONAZEPAM 0.5 MG PO TABS: 0.5000 mg | ORAL_TABLET | Freq: Every evening | ORAL | 3 refills | Status: DC | PRN

## 2023-04-27 ENCOUNTER — Encounter: Payer: Self-pay | Admitting: Neurology

## 2023-04-27 ENCOUNTER — Ambulatory Visit: Payer: PPO | Admitting: Neurology

## 2023-04-27 VITALS — BP 124/79 | HR 79 | Ht 59.0 in | Wt 114.0 lb

## 2023-04-27 DIAGNOSIS — G43709 Chronic migraine without aura, not intractable, without status migrainosus: Secondary | ICD-10-CM | POA: Diagnosis not present

## 2023-04-27 DIAGNOSIS — M5481 Occipital neuralgia: Secondary | ICD-10-CM

## 2023-04-27 DIAGNOSIS — G501 Atypical facial pain: Secondary | ICD-10-CM | POA: Diagnosis not present

## 2023-04-27 DIAGNOSIS — G5131 Clonic hemifacial spasm, right: Secondary | ICD-10-CM | POA: Diagnosis not present

## 2023-04-27 DIAGNOSIS — M47812 Spondylosis without myelopathy or radiculopathy, cervical region: Secondary | ICD-10-CM | POA: Diagnosis not present

## 2023-04-27 NOTE — Progress Notes (Signed)
[facility]  PATIENT: Regina Neal DOB: 04-03-1956  REFERRING DOCTOR OR PCP: Scheryl Marten, MD, Nolon Stalls, PA-C SOURCE: Patient, notes from [facility], laboratory reports, imaging reports, MRI of the brain 11/24/2019 personally reviewed.  _________________________________   HISTORICAL  CHIEF COMPLAINT:  Chief Complaint  Patient presents with   Follow-up    Pt in room 11. Here for occipital neuralgia of right side. Pt reports being stable, no new symptoms.     HISTORY OF PRESENT ILLNESS:  Regina Neal is a 67 y.o.  woman with an abnormal brain MRI and concern about multiple sclerosis.  Update 04/27/2023: She reports significant right headache  Worse pain is in the occiput and above the right eye.    Pain is constant with a superimposed shooting.    She also has some cheek pain (V2) and was told she has trigeminal neuralgia.    WIth the headaches she sometimes gets nausea.    She has had some procedures.   In the past, she saw Pain Management.   She has occipital nerve blocks lasting just hours.     She had an occipital nerve stimulator in the past but it was removed when she had MRIs done.  She dd not find a benefit..   We had ordered ESI and the first one helped a couple weeks and second one had no benefit.    She had Occipital nerve RFA 07/14/2022 ([facility]-Dr. Rauck) but no sustained benefit.     She is still on gabapentin and baclofen without much help.   Lamotrigine and tizanidine had not helped.   She was sleeping poorly due to the pain.   Clonazepam had been restarted and she is doing better.    She was on oxcarbazepine but felt sick (notes starting on 600 mg po bid).   She tried Botox.    Currently, she denies difficulty with gait, strength or limb sensation.   She has altered sensation in the right face in V1 or V2.     Vision is doing well and she had frequet evaluatin by Dr. Daphine Deutscher, neuro-ophthalmology.       She denies bladder issues.   No  difficulty with cognition.  She has mild depression on days she has more pain but not when pain is absent or mild.   She has insomnia and has started [place] with some benefit.   Meds tried for neuralgia/migraine pain:  Baclofen, NSAID, Lyrica, topiramate, zonisamide, Marinol, Valproic acid, tramadol, methadone, Vimpat, Levetiracetam, steroid, Horizant, Lamotrigine, duloxetine, carbamazepine, Nuedexta, Aptiom , amitriptyline, nortriptyline, oxcarbazepine, Belbuca, Butrans, Dilantin.      She had decompression surgery for hemifacial spasm on the right but she still notes some spasms.     Abnormal MRI history/details Around 2000, she started to experience a burning sensation in her right eye and adjacent to the orbit.  She had pain that was constant with superimposed intermittent worsening.  She was diagnosed with trigeminal neuralgia in 2000 and had microvascular decompression that year (Dr. Garnette Czech) initially with benefit for a while.    She had recurence in 2008 and had a subsequent operation 2008 without improvement.   Multiple different medications were tried for the trigeminal neuralgia without much benefit.  As part of her evaluation for trigeminal neuralgia, MRIs of the brain were performed.  They showed white matter abnormalities.  She was diagnosed with MS in 2008 by Dr. Leotis Shames.  She does not believe she had a lumbar puncture around that time.  She was placed on Avonex initially (2 years) but due to low WBC switched to Copaxone in 2010 and stayed on until 2019 when she had insurance issues.   She then did Ocrevus for 3 infusions, last one around December 2020.    She started to see Dr. Gaynelle Adu who questioned the diagnosis if MS.   She did an LP 08/2020 which was normal.   Based on the appearance of the MRI and the CSF results, Dr. Gaynelle Adu felt she did not have MS.   I saw her for third opinion 03/25/2021 and also felt that she did not have multiple sclerosis.   Imaging review personally  reviewed: MRI of the brain 11/24/2019 shows scattered T2/FLAIR hyperintense foci predominantly in the subcortical and deep white matter.  These have an appearance more consistent with chronic microvascular ischemic change rather than demyelination.  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  There has been a trigeminal decompression surgery on the right with evidence of right retrosigmoid craniotomy.   The trigeminal nerve appears normal..  Arterial flow in that region was normal.  MR angiogram of the brain and neck 11/24/2019 shows no stenosis  MRIs of the brain reports reports in 2012, 2014 and 2018 ([facility]) were read as being consistent with multiple sclerosis.  There is also comment that there had been no progression between 2012 and 2014 and between 2014 and 2018  MRI cervical spine 11/01/2021 shows left facet fusion at [facility] and right facet hypertrophy at C3C4.   Marland Kitchen  Minimal retroplisthesis at [facility] with borderline spinal stenosis and moderate foraminal narrowing at C5C6 and C6C7.     Laboratory review: CSF 11/29/2019 showed normal kappa free light chains Serum 09/19/2020 showed normal B12 and hemoglobin A1c.  Hepatitis C Ab was nonreactive.   05/27/2021: ESR 12 and CRP were both normal  REVIEW OF SYSTEMS: Constitutional: No fevers, chills, sweats, or change in appetite Eyes: No visual changes, double vision, eye pain Ear, nose and throat: No hearing loss, ear pain, nasal congestion, sore throat Cardiovascular: No chest pain, palpitations Respiratory:  No shortness of breath at rest or with exertion.   No wheezes GastrointestinaI: No nausea, vomiting, diarrhea, abdominal pain, fecal incontinence Genitourinary:  No dysuria, urinary retention or frequency.  No nocturia. Musculoskeletal:  No neck pain, back pain Integumentary: No rash, pruritus, skin lesions Neurological: as above Psychiatric: No depression at this time.  No anxiety Endocrine: No palpitations,  diaphoresis, change in appetite, change in weigh or increased thirst Hematologic/Lymphatic:  No anemia, purpura, petechiae. Allergic/Immunologic: No itchy/runny eyes, nasal congestion, recent allergic reactions, rashes  ALLERGIES: Allergies  Allergen Reactions   Buprenorphine Swelling   Verapamil Rash    HOME MEDICATIONS:  Current Outpatient Medications:    amLODipine (NORVASC) 10 MG tablet, Take 1 tablet (10 mg total) by mouth daily., Disp: 100 tablet, Rfl: 1   Ascorbic Acid (VITAMIN C) 500 MG CAPS, Take 2 capsules by mouth every other day., Disp: , Rfl:    aspirin 81 MG chewable tablet, Chew by mouth daily., Disp: , Rfl:    baclofen (LIORESAL) 20 MG tablet, Take 1 tablet (20 mg total) by mouth 3 (three) times daily., Disp: 270 each, Rfl: 1   calcium carbonate (OSCAL) 1500 (600 Ca) MG TABS tablet, Take by mouth daily with breakfast., Disp: , Rfl:    cholecalciferol (VITAMIN D) 1000 units tablet, Take 2,000 Units by mouth daily., Disp: , Rfl:    clonazePAM (KLONOPIN) 0.5 MG tablet, Take  1 tablet (0.5 mg total) by mouth at bedtime as needed., Disp: 30 tablet, Rfl: 3   cyclobenzaprine (FLEXERIL) 5 MG tablet, Take 1 tablet (5 mg total) by mouth every 8 (eight) hours as needed for muscle spasms., Disp: 90 tablet, Rfl: 5   DULoxetine (CYMBALTA) 20 MG capsule, Take 1 capsule (20 mg total) by mouth every morning., Disp: 90 capsule, Rfl: 3   gabapentin (NEURONTIN) 600 MG tablet, Take 1 tablet (600 mg total) by mouth 4 (four) times daily., Disp: 400 tablet, Rfl: 1   Multiple Vitamin (MULTI-VITAMIN DAILY) TABS, Take 1 tablet by mouth every other day., Disp: , Rfl:    NON FORMULARY, Apply 1 patch topically as needed. Lidoderm patch, Disp: , Rfl:    Omega-3 Fatty Acids (FISH OIL OMEGA-3 PO), Take 1 tablet by mouth every other day., Disp: , Rfl:    omeprazole (PRILOSEC) 20 MG capsule, Take 1 capsule (20 mg total) by mouth daily., Disp: 100 capsule, Rfl: 1   rizatriptan (MAXALT) 10 MG tablet, Take 1  tablet (10 mg total) by mouth as needed for migraine. May repeat in 2 hours if needed, Disp: 10 tablet, Rfl: 6   rosuvastatin (CRESTOR) 20 MG tablet, Take 1 tablet (20 mg total) by mouth daily., Disp: 100 tablet, Rfl: 1   vitamin E 180 MG (400 UNITS) capsule, Take 1 capsule by mouth every other day., Disp: , Rfl:   Current Facility-Administered Medications:    triamcinolone acetonide (KENALOG-40) injection 10 mg, 10 mg, Other, Once, McDonald, Rachelle Hora, DPM  PAST MEDICAL HISTORY: Past Medical History:  Diagnosis Date   Anxiety    Asthma    exercised induced   Cataract    Hypertension    Multiple sclerosis (HCC)    Stroke (HCC)    Trigeminal neuralgia     PAST SURGICAL HISTORY: Past Surgical History:  Procedure Laterality Date   INSERTION / PLACEMENT / REVISION NEUROSTIMULATOR Right 2015   TUBAL LIGATION      FAMILY HISTORY: Family History  Problem Relation Age of Onset   Cancer Mother    Heart disease Mother    Lung cancer Father    Cancer Father    Stroke Maternal Grandmother     SOCIAL HISTORY:  Social History   Socioeconomic History   Marital status: Legally Separated    Spouse name: Not on file   Number of children: 2   Years of education: Not on file   Highest education level: 12th grade  Occupational History   Not on file  Tobacco Use   Smoking status: Never   Smokeless tobacco: Never  Vaping Use   Vaping status: Never Used  Substance and Sexual Activity   Alcohol use: Never   Drug use: Never   Sexual activity: Not Currently    Birth control/protection: Surgical  Other Topics Concern   Not on file  Social History Narrative   Lives alone   Right handed   Caffeine: 2 cups of coffee and 1 glass of tea, and half a soda a day   Social Determinants of Health   Financial Resource Strain: Low Risk  (02/25/2023)   Overall Financial Resource Strain (CARDIA)    Difficulty of Paying Living Expenses: Not hard at all  Food Insecurity: No Food Insecurity  (02/25/2023)   Hunger Vital Sign    Worried About Running Out of Food in the Last Year: Never true    Ran Out of Food in the Last Year: Never true  Transportation Needs:  No Transportation Needs (02/25/2023)   PRAPARE - Administrator, Civil Service (Medical): No    Lack of Transportation (Non-Medical): No  Physical Activity: Insufficiently Active (02/25/2023)   Exercise Vital Sign    Days of Exercise per Week: 3 days    Minutes of Exercise per Session: 30 min  Stress: No Stress Concern Present (02/25/2023)   [employer] of Occupational Health - Occupational Stress Questionnaire    Feeling of Stress : Only a little  Social Connections: Moderately Integrated (02/25/2023)   Social Connection and Isolation Panel [NHANES]    Frequency of Communication with Friends and Family: More than three times a week    Frequency of Social Gatherings with Friends and Family: Three times a week    Attends Religious Services: 1 to 4 times per year    Active Member of Clubs or Organizations: Yes    Attends Banker Meetings: More than 4 times per year    Marital Status: Separated  Intimate Partner Violence: Not At Risk (02/25/2023)   Humiliation, Afraid, Rape, and Kick questionnaire    Fear of Current or Ex-Partner: No    Emotionally Abused: No    Physically Abused: No    Sexually Abused: No     PHYSICAL EXAM  Vitals:   04/27/23 1309  BP: 124/79  Pulse: 79  Weight: 114 lb (51.7 kg)  Height: 4\' 11"  (1.499 m)     Body mass index is 23.03 kg/m.   General: The patient is well-developed and well-nourished and in no acute distress  HEENT:  Head is [place]/AT.  Sclera are anicteric.  Funduscopic exam shows normal optic discs and retinal vessels.  Neck: She has tenderness at the right occiput (and minimal tenderness on the left)..  Range of motion is mildly reduced in the neck.  Skin: Extremities are without rash or  edema.  Musculoskeletal:  Back is  nontender  Neurologic Exam  Mental status: The patient is alert and oriented x 3 at the time of the examination. The patient has apparent normal recent and remote memory, with an apparently normal attention span and concentration ability.   Speech is normal.  Cranial Nerves:  EOMI,   She reported reduced temperature sensation in the right V1 and V2 distributions.  .Facial strength is normal.  Few twitches in the right face.  Trapezius and sternocleidomastoid strength is normal. No dysarthria is noted.   No obvious hearing deficits are noted.  Motor:  Muscle bulk is normal.   Tone is normal. Strength is  5 / 5 in all 4 extremities.   Sensory: Sensory testing is intact to pinprick, soft touch and vibration sensation in all 4 extremities.  Coordination: Cerebellar testing reveals good finger-nose-finger and heel-to-shin bilaterally.  Gait and station: Station is normal.  Gait and tandem gait are normal for age.. Romberg is negative.   Reflexes: Deep tendon reflexes are symmetric and normal bilaterally.        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  We will have her re-try oxcarbazepine but at a lower dose of 150 mg po bid a while before increasing to 300 mg po bid.   Still has some hemifacial spasm on the right.   Consider Botox if no  benefit oxcarbazepine (could help both the HA and HFS) She will return to see [place] in 6 months or sooner if there are new or worsening neurologic symptoms.  This visit is part of a comprehensive longitudinal care medical relationship regarding the patients primary diagnosis of headache syndromes and related concerns.  Regina Neal A. Epimenio Foot, MD, [facility] 04/27/2023, 2:06 PM Certified in  Neurology, Clinical Neurophysiology, Sleep Medicine and Neuroimaging  [facility] [address] [place], [place] [address] [phone]

## 2023-04-28 DIAGNOSIS — S338XXA Sprain of other parts of lumbar spine and pelvis, initial encounter: Secondary | ICD-10-CM | POA: Diagnosis not present

## 2023-04-28 DIAGNOSIS — S134XXA Sprain of ligaments of cervical spine, initial encounter: Secondary | ICD-10-CM | POA: Diagnosis not present

## 2023-04-28 DIAGNOSIS — M9902 Segmental and somatic dysfunction of thoracic region: Secondary | ICD-10-CM | POA: Diagnosis not present

## 2023-04-28 DIAGNOSIS — M9901 Segmental and somatic dysfunction of cervical region: Secondary | ICD-10-CM | POA: Diagnosis not present

## 2023-04-28 DIAGNOSIS — S233XXA Sprain of ligaments of thoracic spine, initial encounter: Secondary | ICD-10-CM | POA: Diagnosis not present

## 2023-05-05 DIAGNOSIS — S233XXA Sprain of ligaments of thoracic spine, initial encounter: Secondary | ICD-10-CM | POA: Diagnosis not present

## 2023-05-05 DIAGNOSIS — M9901 Segmental and somatic dysfunction of cervical region: Secondary | ICD-10-CM | POA: Diagnosis not present

## 2023-05-05 DIAGNOSIS — S338XXA Sprain of other parts of lumbar spine and pelvis, initial encounter: Secondary | ICD-10-CM | POA: Diagnosis not present

## 2023-05-05 DIAGNOSIS — M9902 Segmental and somatic dysfunction of thoracic region: Secondary | ICD-10-CM | POA: Diagnosis not present

## 2023-05-05 DIAGNOSIS — S134XXA Sprain of ligaments of cervical spine, initial encounter: Secondary | ICD-10-CM | POA: Diagnosis not present

## 2023-05-09 ENCOUNTER — Telehealth: Payer: Self-pay | Admitting: Neurology

## 2023-05-09 ENCOUNTER — Other Ambulatory Visit: Payer: Self-pay | Admitting: Neurology

## 2023-05-09 ENCOUNTER — Encounter: Payer: Self-pay | Admitting: Neurology

## 2023-05-09 DIAGNOSIS — I639 Cerebral infarction, unspecified: Secondary | ICD-10-CM

## 2023-05-09 DIAGNOSIS — M5481 Occipital neuralgia: Secondary | ICD-10-CM

## 2023-05-09 DIAGNOSIS — Z79899 Other long term (current) drug therapy: Secondary | ICD-10-CM

## 2023-05-09 NOTE — Telephone Encounter (Signed)
[place] adv NPR sent to GI [phone]

## 2023-05-11 DIAGNOSIS — S134XXA Sprain of ligaments of cervical spine, initial encounter: Secondary | ICD-10-CM | POA: Diagnosis not present

## 2023-05-11 DIAGNOSIS — S338XXA Sprain of other parts of lumbar spine and pelvis, initial encounter: Secondary | ICD-10-CM | POA: Diagnosis not present

## 2023-05-11 DIAGNOSIS — S233XXA Sprain of ligaments of thoracic spine, initial encounter: Secondary | ICD-10-CM | POA: Diagnosis not present

## 2023-05-11 DIAGNOSIS — M9901 Segmental and somatic dysfunction of cervical region: Secondary | ICD-10-CM | POA: Diagnosis not present

## 2023-05-11 DIAGNOSIS — M9902 Segmental and somatic dysfunction of thoracic region: Secondary | ICD-10-CM | POA: Diagnosis not present

## 2023-05-15 ENCOUNTER — Encounter: Payer: Self-pay | Admitting: Neurology

## 2023-05-17 ENCOUNTER — Other Ambulatory Visit: Payer: Self-pay | Admitting: *Deleted

## 2023-05-17 DIAGNOSIS — M5481 Occipital neuralgia: Secondary | ICD-10-CM

## 2023-05-17 DIAGNOSIS — Z79899 Other long term (current) drug therapy: Secondary | ICD-10-CM

## 2023-05-18 DIAGNOSIS — S134XXA Sprain of ligaments of cervical spine, initial encounter: Secondary | ICD-10-CM | POA: Diagnosis not present

## 2023-05-18 DIAGNOSIS — S233XXA Sprain of ligaments of thoracic spine, initial encounter: Secondary | ICD-10-CM | POA: Diagnosis not present

## 2023-05-18 DIAGNOSIS — M9901 Segmental and somatic dysfunction of cervical region: Secondary | ICD-10-CM | POA: Diagnosis not present

## 2023-05-18 DIAGNOSIS — M9902 Segmental and somatic dysfunction of thoracic region: Secondary | ICD-10-CM | POA: Diagnosis not present

## 2023-05-18 DIAGNOSIS — S338XXA Sprain of other parts of lumbar spine and pelvis, initial encounter: Secondary | ICD-10-CM | POA: Diagnosis not present

## 2023-05-19 ENCOUNTER — Ambulatory Visit
Admission: RE | Admit: 2023-05-19 | Discharge: 2023-05-19 | Disposition: A | Discharge status: Home / Self Care | Payer: PPO | Source: Ambulatory Visit | Attending: Neurology | Admitting: Neurology

## 2023-05-19 DIAGNOSIS — I639 Cerebral infarction, unspecified: Secondary | ICD-10-CM | POA: Diagnosis not present

## 2023-05-19 DIAGNOSIS — M5481 Occipital neuralgia: Secondary | ICD-10-CM

## 2023-05-19 MED ORDER — GADOPICLENOL 0.5 MMOL/ML IV SOLN
5.0000 mL | Freq: Once | INTRAVENOUS | Status: AC | PRN
  Administered 2023-05-19: 5 mL via INTRAVENOUS

## 2023-05-20 DIAGNOSIS — Z79899 Other long term (current) drug therapy: Secondary | ICD-10-CM | POA: Diagnosis not present

## 2023-05-20 DIAGNOSIS — M5481 Occipital neuralgia: Secondary | ICD-10-CM | POA: Diagnosis not present

## 2023-05-21 LAB — BASIC METABOLIC PANEL
BUN/Creatinine Ratio: 12 (ref 12–28)
BUN: 10 mg/dL (ref 8–27)
CO2: 25 mmol/L (ref 20–29)
Calcium: 9.2 mg/dL (ref 8.7–10.3)
Chloride: 99 mmol/L (ref 96–106)
Creatinine, Ser: 0.82 mg/dL (ref 0.57–1.00)
Glucose: 75 mg/dL (ref 70–99)
Potassium: 4.3 mmol/L (ref 3.5–5.2)
Sodium: 139 mmol/L (ref 134–144)
eGFR: 77 mL/min/{1.73_m2} (ref >59)

## 2023-05-22 ENCOUNTER — Encounter: Payer: Self-pay | Admitting: Neurology

## 2023-05-23 ENCOUNTER — Telehealth: Payer: Self-pay | Admitting: Neurology

## 2023-05-23 NOTE — Telephone Encounter (Signed)
I spoke to the patient about her brain MRI.  It shows multiple T2/FLAIR hyperintense foci most consistent with moderate chronic microvascular ischemic changes.  She also has sequela of the surgery for trigeminal neuralgia on the right.     Unfortunately, I do not have her comparison MRI from 2021.  I will try to get her old MRI so I can do a side-by-side comparison to see if there has been any significant progression.    In the past, she was diagnosed with MS but the changes look more like chronic microvascular ischemic change.

## 2023-05-24 ENCOUNTER — Encounter: Payer: Self-pay | Admitting: Neurology

## 2023-05-31 ENCOUNTER — Encounter: Payer: Self-pay | Admitting: Neurology

## 2023-06-03 DIAGNOSIS — S134XXA Sprain of ligaments of cervical spine, initial encounter: Secondary | ICD-10-CM | POA: Diagnosis not present

## 2023-06-03 DIAGNOSIS — M9902 Segmental and somatic dysfunction of thoracic region: Secondary | ICD-10-CM | POA: Diagnosis not present

## 2023-06-03 DIAGNOSIS — S233XXA Sprain of ligaments of thoracic spine, initial encounter: Secondary | ICD-10-CM | POA: Diagnosis not present

## 2023-06-03 DIAGNOSIS — S338XXA Sprain of other parts of lumbar spine and pelvis, initial encounter: Secondary | ICD-10-CM | POA: Diagnosis not present

## 2023-06-03 DIAGNOSIS — M9901 Segmental and somatic dysfunction of cervical region: Secondary | ICD-10-CM | POA: Diagnosis not present

## 2023-06-14 DIAGNOSIS — G500 Trigeminal neuralgia: Secondary | ICD-10-CM | POA: Diagnosis not present

## 2023-06-14 DIAGNOSIS — H5340 Unspecified visual field defects: Secondary | ICD-10-CM | POA: Diagnosis not present

## 2023-06-14 DIAGNOSIS — H47091 Other disorders of optic nerve, not elsewhere classified, right eye: Secondary | ICD-10-CM | POA: Diagnosis not present

## 2023-06-15 ENCOUNTER — Encounter: Payer: Self-pay | Admitting: Neurology

## 2023-06-17 DIAGNOSIS — M9901 Segmental and somatic dysfunction of cervical region: Secondary | ICD-10-CM | POA: Diagnosis not present

## 2023-06-17 DIAGNOSIS — M9902 Segmental and somatic dysfunction of thoracic region: Secondary | ICD-10-CM | POA: Diagnosis not present

## 2023-06-17 DIAGNOSIS — S134XXA Sprain of ligaments of cervical spine, initial encounter: Secondary | ICD-10-CM | POA: Diagnosis not present

## 2023-06-17 DIAGNOSIS — S233XXA Sprain of ligaments of thoracic spine, initial encounter: Secondary | ICD-10-CM | POA: Diagnosis not present

## 2023-06-17 DIAGNOSIS — S338XXA Sprain of other parts of lumbar spine and pelvis, initial encounter: Secondary | ICD-10-CM | POA: Diagnosis not present

## 2023-06-27 ENCOUNTER — Telehealth: Payer: Self-pay | Admitting: *Deleted

## 2023-06-27 NOTE — Telephone Encounter (Signed)
R/c cd from [facility] pt cd in Pod.

## 2023-07-03 ENCOUNTER — Encounter: Payer: Self-pay | Admitting: Neurology

## 2023-07-06 NOTE — Telephone Encounter (Signed)
Pt called in this morning wanting to check on MRI results. I let her know that we were out of the office tomorrow but that I'd send a message letting Dr. Epimenio Foot know she was checking in.

## 2023-07-21 DIAGNOSIS — G245 Blepharospasm: Secondary | ICD-10-CM | POA: Diagnosis not present

## 2023-07-21 DIAGNOSIS — G500 Trigeminal neuralgia: Secondary | ICD-10-CM | POA: Diagnosis not present

## 2023-07-21 DIAGNOSIS — H47091 Other disorders of optic nerve, not elsewhere classified, right eye: Secondary | ICD-10-CM | POA: Diagnosis not present

## 2023-07-21 DIAGNOSIS — H5340 Unspecified visual field defects: Secondary | ICD-10-CM | POA: Diagnosis not present

## 2023-07-25 DIAGNOSIS — G500 Trigeminal neuralgia: Secondary | ICD-10-CM | POA: Diagnosis not present

## 2023-07-25 DIAGNOSIS — G5131 Clonic hemifacial spasm, right: Secondary | ICD-10-CM | POA: Diagnosis not present

## 2023-07-25 DIAGNOSIS — M5481 Occipital neuralgia: Secondary | ICD-10-CM | POA: Diagnosis not present

## 2023-08-10 ENCOUNTER — Other Ambulatory Visit: Payer: Self-pay | Admitting: Neurology

## 2023-08-10 ENCOUNTER — Other Ambulatory Visit: Payer: Self-pay | Admitting: Family Medicine

## 2023-08-10 NOTE — Telephone Encounter (Signed)
Last seen on 04/27/23 Follow up scheduled on 11/10/23

## 2023-08-17 DIAGNOSIS — H47091 Other disorders of optic nerve, not elsewhere classified, right eye: Secondary | ICD-10-CM | POA: Diagnosis not present

## 2023-08-17 DIAGNOSIS — G245 Blepharospasm: Secondary | ICD-10-CM | POA: Diagnosis not present

## 2023-08-17 DIAGNOSIS — H5340 Unspecified visual field defects: Secondary | ICD-10-CM | POA: Diagnosis not present

## 2023-08-17 DIAGNOSIS — G500 Trigeminal neuralgia: Secondary | ICD-10-CM | POA: Diagnosis not present

## 2023-08-27 ENCOUNTER — Emergency Department (HOSPITAL_COMMUNITY): Payer: PPO

## 2023-08-27 ENCOUNTER — Other Ambulatory Visit: Payer: Self-pay

## 2023-08-27 ENCOUNTER — Emergency Department (HOSPITAL_COMMUNITY)
Admission: EM | Admit: 2023-08-27 | Discharge: 2023-08-27 | Disposition: A | Discharge status: Home / Self Care | Payer: PPO | Attending: Emergency Medicine | Admitting: Emergency Medicine

## 2023-08-27 ENCOUNTER — Encounter (HOSPITAL_COMMUNITY): Payer: Self-pay

## 2023-08-27 DIAGNOSIS — M4312 Spondylolisthesis, cervical region: Secondary | ICD-10-CM | POA: Diagnosis not present

## 2023-08-27 DIAGNOSIS — M5080 Other cervical disc disorders, unspecified cervical region: Secondary | ICD-10-CM | POA: Diagnosis not present

## 2023-08-27 DIAGNOSIS — Z7982 Long term (current) use of aspirin: Secondary | ICD-10-CM | POA: Insufficient documentation

## 2023-08-27 DIAGNOSIS — W01198A Fall on same level from slipping, tripping and stumbling with subsequent striking against other object, initial encounter: Secondary | ICD-10-CM | POA: Insufficient documentation

## 2023-08-27 DIAGNOSIS — R55 Syncope and collapse: Secondary | ICD-10-CM | POA: Insufficient documentation

## 2023-08-27 DIAGNOSIS — S025XXA Fracture of tooth (traumatic), initial encounter for closed fracture: Secondary | ICD-10-CM | POA: Diagnosis not present

## 2023-08-27 DIAGNOSIS — S0993XA Unspecified injury of face, initial encounter: Secondary | ICD-10-CM | POA: Diagnosis present

## 2023-08-27 DIAGNOSIS — I10 Essential (primary) hypertension: Secondary | ICD-10-CM | POA: Diagnosis not present

## 2023-08-27 DIAGNOSIS — Z23 Encounter for immunization: Secondary | ICD-10-CM | POA: Diagnosis not present

## 2023-08-27 DIAGNOSIS — R9082 White matter disease, unspecified: Secondary | ICD-10-CM | POA: Diagnosis not present

## 2023-08-27 DIAGNOSIS — S032XXA Dislocation of tooth, initial encounter: Secondary | ICD-10-CM | POA: Diagnosis not present

## 2023-08-27 DIAGNOSIS — M4802 Spinal stenosis, cervical region: Secondary | ICD-10-CM | POA: Diagnosis not present

## 2023-08-27 DIAGNOSIS — S01511A Laceration without foreign body of lip, initial encounter: Secondary | ICD-10-CM | POA: Diagnosis not present

## 2023-08-27 DIAGNOSIS — Z79899 Other long term (current) drug therapy: Secondary | ICD-10-CM | POA: Insufficient documentation

## 2023-08-27 LAB — CBC WITH DIFFERENTIAL/PLATELET
Abs Immature Granulocytes: 0.01 10*3/uL (ref 0.00–0.07)
Basophils Absolute: 0 10*3/uL (ref 0.0–0.1)
Basophils Relative: 0 %
Eosinophils Absolute: 0 10*3/uL (ref 0.0–0.5)
Eosinophils Relative: 0 %
HCT: 35.2 % — ABNORMAL LOW (ref 36.0–46.0)
Hemoglobin: 12.1 g/dL (ref 12.0–15.0)
Immature Granulocytes: 0 %
Lymphocytes Relative: 13 %
Lymphs Abs: 0.7 10*3/uL (ref 0.7–4.0)
MCH: 33.2 pg (ref 26.0–34.0)
MCHC: 34.4 g/dL (ref 30.0–36.0)
MCV: 96.7 fL (ref 80.0–100.0)
Monocytes Absolute: 0.5 10*3/uL (ref 0.1–1.0)
Monocytes Relative: 9 %
Neutro Abs: 4.4 10*3/uL (ref 1.7–7.7)
Neutrophils Relative %: 78 %
Platelets: 207 10*3/uL (ref 150–400)
RBC: 3.64 MIL/uL — ABNORMAL LOW (ref 3.87–5.11)
RDW: 12.6 % (ref 11.5–15.5)
WBC: 5.7 10*3/uL (ref 4.0–10.5)
nRBC: 0 % (ref 0.0–0.2)

## 2023-08-27 LAB — BASIC METABOLIC PANEL
Anion gap: 8 (ref 5–15)
BUN: 6 mg/dL — ABNORMAL LOW (ref 8–23)
CO2: 25 mmol/L (ref 22–32)
Calcium: 9.1 mg/dL (ref 8.9–10.3)
Chloride: 97 mmol/L — ABNORMAL LOW (ref 98–111)
Creatinine, Ser: 0.46 mg/dL (ref 0.44–1.00)
GFR, Estimated: >60 mL/min (ref >60)
Glucose, Bld: 117 mg/dL — ABNORMAL HIGH (ref 70–99)
Potassium: 3.4 mmol/L — ABNORMAL LOW (ref 3.5–5.1)
Sodium: 130 mmol/L — ABNORMAL LOW (ref 135–145)

## 2023-08-27 LAB — CBG MONITORING, ED: Glucose-Capillary: 108 mg/dL — ABNORMAL HIGH (ref 70–99)

## 2023-08-27 MED ORDER — AMOXICILLIN-POT CLAVULANATE 875-125 MG PO TABS: 1.0000 | ORAL_TABLET | Freq: Two times a day (BID) | ORAL | 0 refills | Status: AC

## 2023-08-27 MED ORDER — LIDOCAINE HCL (PF) 1 % IJ SOLN
30.0000 mL | Freq: Once | INTRAMUSCULAR | Status: AC
  Administered 2023-08-27: 30 mL
  Filled 2023-08-27: qty 30

## 2023-08-27 MED ORDER — TETANUS-DIPHTH-ACELL PERTUSSIS 5-2.5-18.5 LF-MCG/0.5 IM SUSY
0.5000 mL | PREFILLED_SYRINGE | Freq: Once | INTRAMUSCULAR | Status: AC
  Administered 2023-08-27: 0.5 mL via INTRAMUSCULAR
  Filled 2023-08-27: qty 0.5

## 2023-08-27 NOTE — ED Triage Notes (Signed)
Pt reports she was rushing to the bathroom at 11 pm last night and slipped and hit her mouth on the hard wood floor.  Pt knocked out her right front tooth and has a lip laceration.

## 2023-08-27 NOTE — ED Provider Notes (Signed)
[facility] Provider Note   CSN: [id number] Arrival date & time: 08/27/23  1106     History  Chief Complaint  Patient presents with   Fall   Lip Laceration    Regina Neal is a 67 y.o. female.  She has PMH of trigeminal neuralgia, cerebellar infarct, GERD, osteopenia, hypertension.  Presents to ER today for a laceration to her upper lip and a displaced tooth.  Patient reports she fell last night.  She was lying in bed and started feeling nauseous, she got up and ran to the bathroom but started feeling very lightheaded so turned around to make her way back to the bed.  She reports she woke up in bed this morning and noted her tooth was missing and she had a laceration on her lip and saw on the floor there was blood where she had apparently fallen but she does not remember falling.  Lasting for hours was getting dizzy.  She denies any chest pain or shortness of breath or palpitations preceding this.  No history of this in the past   Fall       Home Medications Prior to Admission medications   Medication Sig Start Date End Date Taking? Authorizing Provider  amoxicillin-clavulanate (AUGMENTIN) 875-125 MG tablet Take 1 tablet by mouth every 12 (twelve) hours for 5 days. 08/27/23 09/01/23 Yes Markeith Jue A, PA-C  amLODipine (NORVASC) 10 MG tablet Take 1 tablet by mouth once daily 08/10/23   Tommie Sams, DO  Ascorbic Acid (VITAMIN C) 500 MG CAPS Take 2 capsules by mouth every other day.    [provider]  aspirin 81 MG chewable tablet Chew by mouth daily.    [provider]  baclofen (LIORESAL) 20 MG tablet TAKE 1 TABLET BY MOUTH THREE TIMES DAILY 08/10/23   Sater, Pearletha Furl, MD  calcium carbonate (OSCAL) 1500 (600 Ca) MG TABS tablet Take by mouth daily with breakfast.    [provider]  cholecalciferol (VITAMIN D) 1000 units tablet Take 2,000 Units by mouth daily.    [provider]  clonazePAM  (KLONOPIN) 0.5 MG tablet TAKE 1 TABLET BY MOUTH AT BEDTIME AS NEEDED 08/10/23   Tommie Sams, DO  cyclobenzaprine (FLEXERIL) 5 MG tablet Take 1 tablet (5 mg total) by mouth every 8 (eight) hours as needed for muscle spasms. 10/20/21   Sater, Pearletha Furl, MD  DULoxetine (CYMBALTA) 20 MG capsule Take 1 capsule (20 mg total) by mouth every morning. 11/08/22   Tommie Sams, DO  gabapentin (NEURONTIN) 600 MG tablet Take 1 tablet (600 mg total) by mouth 4 (four) times daily. 02/23/23   Sater, Pearletha Furl, MD  Multiple Vitamin (MULTI-VITAMIN DAILY) TABS Take 1 tablet by mouth every other day.    [provider]  NON FORMULARY Apply 1 patch topically as needed. Lidoderm patch    [provider]  Omega-3 Fatty Acids (FISH OIL OMEGA-3 PO) Take 1 tablet by mouth every other day.    [provider]  omeprazole (PRILOSEC) 20 MG capsule Take 1 capsule by mouth once daily 08/10/23   Everlene Other G, DO  rizatriptan (MAXALT) 10 MG tablet Take 1 tablet (10 mg total) by mouth as needed for migraine. May repeat in 2 hours if needed 11/23/21   Sater, Pearletha Furl, MD  rosuvastatin (CRESTOR) 20 MG tablet Take 1 tablet (20 mg total) by mouth daily. 03/22/23   Tommie Sams, DO  vitamin E  180 MG (400 UNITS) capsule Take 1 capsule by mouth every other day.    [provider]      Allergies    Buprenorphine and Verapamil    Review of Systems   Review of Systems  Physical Exam Updated Vital Signs BP 139/80   Pulse 94   Temp 99 F (37.2 C) (Oral)   Resp (!) 25   Ht 4\' 11"  (1.499 m)   Wt 52.2 kg   SpO2 95%   BMI 23.23 kg/m  Physical Exam Vitals and nursing note reviewed.  Constitutional:      General: She is not in acute distress.    Appearance: She is well-developed.  HENT:     Head: Normocephalic and atraumatic.     Mouth/Throat:     Mouth: Mucous membranes are moist.     Comments: Laceration to the upper lip that extends from the area just above the right vermilion border down  to the mucosa and up to the vermilion border at midline of the philtrum.  Laceration is jagged and there are several areas with some avulsed tissue.  Right central incisor is avulsed, clot noted in the socket, no active bleeding, adjacent teeth are not loose, small chip on left central incisor Eyes:     Conjunctiva/sclera: Conjunctivae normal.  Cardiovascular:     Rate and Rhythm: Normal rate and regular rhythm.     Heart sounds: No murmur heard. Pulmonary:     Effort: Pulmonary effort is normal. No respiratory distress.     Breath sounds: Normal breath sounds.  Abdominal:     Palpations: Abdomen is soft.     Tenderness: There is no abdominal tenderness.  Musculoskeletal:        General: No swelling.     Cervical back: Normal range of motion and neck supple. No tenderness.  Skin:    General: Skin is warm and dry.     Capillary Refill: Capillary refill takes less than 2 seconds.  Neurological:     General: No focal deficit present.     Mental Status: She is alert and oriented to person, place, and time.  Psychiatric:        Mood and Affect: Mood normal.     ED Results / Procedures / Treatments   Labs (all labs ordered are listed, but only abnormal results are displayed) Labs Reviewed  BASIC METABOLIC PANEL - Abnormal; Notable for the following components:      Result Value   Sodium 130 (*)    Potassium 3.4 (*)    Chloride 97 (*)    Glucose, Bld 117 (*)    BUN 6 (*)    All other components within normal limits  CBC WITH DIFFERENTIAL/PLATELET - Abnormal; Notable for the following components:   RBC 3.64 (*)    HCT 35.2 (*)    All other components within normal limits  CBG MONITORING, ED - Abnormal; Notable for the following components:   Glucose-Capillary 108 (*)    All other components within normal limits    EKG EKG Interpretation Date/Time:  Saturday August 27 2023 12:44:34 EST Ventricular Rate:  85 PR Interval:  171 QRS Duration:  90 QT Interval:  365 QTC  Calculation: 434 R Axis:   69  Text Interpretation: Sinus rhythm RSR' in V1 or V2, right VCD or RVH No old tracing to compare Confirmed by Eber Hong ([id number]) on 08/27/2023 2:44:55 PM  Radiology CT Cervical Spine Wo Contrast Result Date: 08/27/2023 CLINICAL DATA:  Trauma. Fell last night hitting mouth on hardwood floor. Broken tooth. EXAM: CT CERVICAL SPINE WITHOUT CONTRAST TECHNIQUE: Multidetector CT imaging of the cervical spine was performed without intravenous contrast. Multiplanar CT image reconstructions were also generated. RADIATION DOSE REDUCTION: This exam was performed according to the departmental dose-optimization program which includes automated exposure control, adjustment of the mA and/or kV according to patient size and/or use of iterative reconstruction technique. COMPARISON:  MR of the cervical spine 11/01/2021 FINDINGS: Alignment: Slight degenerative anterolisthesis is present at C3-4 and C4-5. Straightening of the normal cervical lordosis is stable. Skull base and vertebrae: Craniocervical junction is normal. Vertebral body heights are normal. No acute fractures are present. Soft tissues and spinal canal: No prevertebral fluid or swelling. No visible canal hematoma. Disc levels: Chronic disc disease and endplate changes are again noted at C5-6 and C6-7. Moderate foraminal stenosis is present bilaterally at both levels, left greater than right. Asymmetric right-sided facet hypertrophy and spurring contributes to right foraminal narrowing at C2-3 and C3-4. Upper chest: The lung apices are clear. The thoracic inlet is within normal limits. IMPRESSION: 1. No acute fracture or traumatic subluxation. 2. Chronic disc disease and endplate changes at C5-6 and C6-7 with moderate foraminal stenosis bilaterally at both levels, left greater than right. 3. Asymmetric right-sided facet hypertrophy and spurring contributes to right foraminal narrowing at C2-3 and C3-4. Electronically Signed   By:  Marin Roberts M.D.   On: 08/27/2023 14:05   CT Head Wo Contrast Result Date: 08/27/2023 CLINICAL DATA:  Fall onto hardwood last night. Trauma to mouth. Broken tooth. Lip laceration. EXAM: CT HEAD WITHOUT CONTRAST TECHNIQUE: Contiguous axial images were obtained from the base of the skull through the vertex without intravenous contrast. RADIATION DOSE REDUCTION: This exam was performed according to the departmental dose-optimization program which includes automated exposure control, adjustment of the mA and/or kV according to patient size and/or use of iterative reconstruction technique. COMPARISON:  MR head without and with contrast 05/19/23 FINDINGS: Brain: Mild atrophy and diffuse white matter changes are stable. No acute infarct, hemorrhage, or mass lesion is present. The ventricles are of normal size. No significant extraaxial fluid collection is present. Deep brain nuclei are within normal limits. A remote infarct in the lateral right cerebellum is stable. The brainstem and cerebellum are otherwise within normal limits. Midline structures are within normal limits. Vascular: No hyperdense vessel or unexpected calcification. Skull: Calvarium is intact. No focal lytic or blastic lesions are present. No significant extracranial soft tissue lesion is present. Sinuses/Orbits: Fluid is present in the left sphenoid sinus. The paranasal sinuses and mastoid air cells are otherwise clear. Bilateral lens replacements are noted. Globes and orbits are otherwise unremarkable. IMPRESSION: 1. No acute intracranial abnormality or significant interval change. 2. Stable atrophy and diffuse white matter disease. This likely reflects the sequela of chronic microvascular ischemia. 3. Stable remote infarct of the lateral right cerebellum. 4. Fluid in the left sphenoid sinus. This likely reflects acute sinusitis. Electronically Signed   By: Marin Roberts M.D.   On: 08/27/2023 14:02    Procedures .Laceration  Repair  Date/Time: 08/27/2023 6:13 PM  Performed by: Ma Rings, PA-C Authorized by: Ma Rings, PA-C   Consent:    Consent obtained:  Verbal   Consent given by:  Patient   Risks discussed:  Infection, pain, poor cosmetic result, need for additional repair, nerve damage and poor wound healing Universal protocol:    Patient identity confirmed:  Verbally with patient Anesthesia:  Anesthesia method:  Local infiltration   Local anesthetic:  Lidocaine 1% w/o epi Laceration details:    Length (cm):  4 Exploration:    Wound exploration: entire depth of wound visualized   Treatment:    Area cleansed with:  Povidone-iodine   Amount of cleaning:  Standard   Irrigation solution:  Sterile saline   Irrigation method:  Syringe Skin repair:    Repair method:  Sutures   Suture size:  5-0   Wound skin closure material used: vicryl rapid.   Suture technique:  Simple interrupted   Number of sutures:  8 Approximation:    Approximation:  Close Repair type:    Repair type:  Simple Post-procedure details:    Dressing:  Open (no dressing)   Procedure completion:  Tolerated well, no immediate complications     Medications Ordered in ED Medications  Tdap (BOOSTRIX) injection 0.5 mL (0.5 mLs Intramuscular Given 08/27/23 1227)  lidocaine (PF) (XYLOCAINE) 1 % injection 30 mL (30 mLs Infiltration Given 08/27/23 1227)    ED Course/ Medical Decision Making/ A&P                                 Medical Decision Making This patient presents to the ED for concern of syncope with lip laceration and tooth avulsion, this involves an extensive number of treatment options, and is a complaint that carries with it a high risk of complications and morbidity.  The differential diagnosis includes vasovagal syncope, orthostatic hypotension, anemia, seizure, other   Co morbidities that complicate the patient evaluation  Trigeminal neuralgia, cerebellar CVA   Additional history  obtained:  Additional history obtained from  External records from outside source obtained and reviewed including prior notes     Imaging Studies ordered:  I ordered imaging studies including CT head and C-spine I independently visualized and interpreted imaging which showed CT head showed chronic changes including chronic cerebellar infarct, no acute hemorrhage or skull fracture; no fracture or traumatic malalignment of C-spine, chronic degenerative changes I agree with the radiologist interpretation     Problem List / ED Course / Critical interventions / Medication management  Syncope-likely orthostatic as patient got up quickly out of bed to around to the back due to nausea.  No dizziness here, no preceding palpitations chest pain or shortness of breath.  Labs show very mild hyponatremia and hypochloremia, normal renal function, but no leukocytosis or anemia, no hypoglycemia.  EKG is normal.  She did have dizziness prior to the syncope.  She has no PE risk factors and no chest pain shortness of breath tachycardia or hypoxia. Tooth avulsion-happened last night 11 PM, patient has already contacted her dentist and is going to see him Monday Lip laceration-patient has jagged upper lip laceration and a V shaped that goes through her vermilion border on the right and midline.  This was irrigated and sutured.  Appears to have lined up well but due to the jagged nature and delay in presentation for repair we discussed that she may have poor cosmetic outcome and if so would need follow-up with plastic surgery.  She verbalized understanding is agreeable with this. Tetanus is updated today, patient cannot remember date of last tetanus shot I ordered medication including lidocaine for local anesthesia Reevaluation of the patient after these medicines showed that the patient improved I have reviewed the patients home medicines and have made adjustments as needed     Amount  and/or Complexity of  Data Reviewed Labs: ordered. Radiology: ordered.  Risk Prescription drug management.           Final Clinical Impression(s) / ED Diagnoses Final diagnoses:  Syncope and collapse  Lip laceration, initial encounter  Tooth avulsion, initial encounter    Rx / DC Orders ED Discharge Orders          Ordered    amoxicillin-clavulanate (AUGMENTIN) 875-125 MG tablet  Every 12 hours        08/27/23 1446              Josem Kaufmann 08/27/23 Silva Bandy    Eber Hong, MD 08/27/23 [phone]

## 2023-08-27 NOTE — Discharge Instructions (Addendum)
It was a pleasure taking care of you today.  You are seen in the ER for evaluation of episode of syncope and laceration to your lip and an avulsed tooth.  We repaired your lip laceration.  We updated your tetanus shot.  Will prescribe you antibiotics to prevent infection.  Keep the laceration clean and dry.  Can use Vaseline or antibiotic ointment topically to keep it moist.  Follow-up with your dentist on Monday for evaluation of your tooth.  Take Tylenol and ibuprofen as needed for pain.  Avoid drinking from a straw or swishing things in your mouth to avoid displacing the clot in your tooth socket.  The sutures to be placed will fall out on their own likely in the next couple weeks.   They passed out due to trying to get to the bathroom quickly.  Make sure to plenty of fluids, if this happens again or you having persistent dizziness you need to be reevaluated.  Follow-up with your PCP.  Return to the ER for any new or worsening symptoms.

## 2023-08-27 NOTE — ED Notes (Signed)
No pacemaker present  

## 2023-09-05 DIAGNOSIS — G500 Trigeminal neuralgia: Secondary | ICD-10-CM | POA: Diagnosis not present

## 2023-09-05 DIAGNOSIS — R55 Syncope and collapse: Secondary | ICD-10-CM | POA: Diagnosis not present

## 2023-09-06 ENCOUNTER — Encounter: Payer: Self-pay | Admitting: Nurse Practitioner

## 2023-09-06 ENCOUNTER — Ambulatory Visit (INDEPENDENT_AMBULATORY_CARE_PROVIDER_SITE_OTHER): Payer: PPO | Admitting: Nurse Practitioner

## 2023-09-06 VITALS — BP 124/74 | HR 83 | Temp 98.1°F | Wt 112.0 lb

## 2023-09-06 DIAGNOSIS — R55 Syncope and collapse: Secondary | ICD-10-CM

## 2023-09-06 DIAGNOSIS — E871 Hypo-osmolality and hyponatremia: Secondary | ICD-10-CM

## 2023-09-06 DIAGNOSIS — F419 Anxiety disorder, unspecified: Secondary | ICD-10-CM

## 2023-09-06 MED ORDER — DULOXETINE HCL 30 MG PO CPEP: 30.0000 mg | ORAL_CAPSULE | Freq: Every day | ORAL | 0 refills | Status: DC

## 2023-09-06 NOTE — Progress Notes (Signed)
 Subjective:    Patient ID: Regina Neal, female    DOB: [birth year], 68 y.o.   MRN: [id number]  HPI Presents for follow-up after an ED visit on 08/27/2023 after a syncopal episode and fall.  Her main concern today is to review her lab work that was done at the hospital. Velton her neurologist at [facility] yesterday.  Is being followed for trigeminal neuralgia and diagnosed with vasovagal syncope.  Was told that she still may have a mild concussion although her CT was normal.  Has chronic headaches, no change.  Could not tolerate the antibiotic that was given to her in the emergency department for possible sinusitis, contacted her dentist prescribed clindamycin. Patient noticed that her sodium level dropped while taking generic Trileptal.  Has since stopped medication.  Her neurologist has ordered a repeat lab work in a couple of weeks for recheck.  Patient has a lip laceration which she states is healing well. Patient is currently on duloxetine  20 mg/day.  Has been tolerating well.  Seems to take the edge off of some of her stress and anxiety.    09/06/2023    3:48 PM  Depression screen PHQ 2/9  Decreased Interest 2  Down, Depressed, Hopeless 1  PHQ - 2 Score 3  Altered sleeping 1  Tired, decreased energy 1  Change in appetite 0  Feeling bad or failure about yourself  0  Trouble concentrating 1  Moving slowly or fidgety/restless 0  Suicidal thoughts 0  PHQ-9 Score 6  Difficult doing work/chores Somewhat difficult      09/06/2023    3:49 PM 07/27/2022    2:33 PM  GAD 7 : Generalized Anxiety Score  Nervous, Anxious, on Edge 1 1  Control/stop worrying 1 0  Worry too much - different things 2 0  Trouble relaxing 2 0  Restless 1 0  Easily annoyed or irritable 1 0  Afraid - awful might happen 2 0  Total GAD 7 Score 10 1  Anxiety Difficulty Somewhat difficult Not difficult at all     Review of Systems  Respiratory:  Negative for cough, chest tightness and shortness of breath.    Cardiovascular:  Negative for chest pain.  Neurological:  Positive for headaches. Negative for syncope.       No further syncopal episodes.  Psychiatric/Behavioral:  Negative for suicidal ideas. The patient is nervous/anxious.        Objective:   Physical Exam NAD.  Alert, oriented.  Calm affect.  Making good eye contact.  Speech clear.  Thoughts logical coherent and relevant.  Dressed appropriately for the weather.  Lungs clear.  Heart regular rate rhythm.  Gets on and off exam table without difficulty.  Gait normal limit. Today's Vitals   09/06/23 1542  BP: 124/74  Pulse: 83  Temp: 98.1 F (36.7 C)  SpO2: 99%  Weight: 112 lb (50.8 kg)   Body mass index is 22.62 kg/m.  Results for orders placed or performed during the hospital encounter of 08/27/23  Basic metabolic panel   Collection Time: 08/27/23 12:24 PM  Result Value Ref Range   Sodium 130 (L) 135 - 145 mmol/L   Potassium 3.4 (L) 3.5 - 5.1 mmol/L   Chloride 97 (L) 98 - 111 mmol/L   CO2 25 22 - 32 mmol/L   Glucose, Bld 117 (H) 70 - 99 mg/dL   BUN 6 (L) 8 - 23 mg/dL   Creatinine, Ser 9.53 0.44 - 1.00 mg/dL   Calcium  9.1  8.9 - 10.3 mg/dL   GFR, Estimated >39 >39 mL/min   Anion gap 8 5 - 15  CBC WITH DIFFERENTIAL   Collection Time: 08/27/23 12:24 PM  Result Value Ref Range   WBC 5.7 4.0 - 10.5 K/uL   RBC 3.64 (L) 3.87 - 5.11 MIL/uL   Hemoglobin 12.1 12.0 - 15.0 g/dL   HCT 64.7 (L) 63.9 - 53.9 %   MCV 96.7 80.0 - 100.0 fL   MCH 33.2 26.0 - 34.0 pg   MCHC 34.4 30.0 - 36.0 g/dL   RDW 87.3 88.4 - 84.4 %   Platelets 207 150 - 400 K/uL   nRBC 0.0 0.0 - 0.2 %   Neutrophils Relative % 78 %   Neutro Abs 4.4 1.7 - 7.7 K/uL   Lymphocytes Relative 13 %   Lymphs Abs 0.7 0.7 - 4.0 K/uL   Monocytes Relative 9 %   Monocytes Absolute 0.5 0.1 - 1.0 K/uL   Eosinophils Relative 0 %   Eosinophils Absolute 0.0 0.0 - 0.5 K/uL   Basophils Relative 0 %   Basophils Absolute 0.0 0.0 - 0.1 K/uL   Immature Granulocytes 0 %   Abs  Immature Granulocytes 0.01 0.00 - 0.07 K/uL  CBG monitoring, ED   Collection Time: 08/27/23 12:26 PM  Result Value Ref Range   Glucose-Capillary 108 (H) 70 - 99 mg/dL   Reviewed labs with patient during visit.      Assessment & Plan:   Problem List Items Addressed This Visit       Other   Anxiety   Relevant Medications   DULoxetine  (CYMBALTA ) 30 MG capsule   Hyponatremia   Vasovagal syncope - Primary   Meds ordered this encounter  Medications   DULoxetine  (CYMBALTA ) 30 MG capsule    Sig: Take 1 capsule (30 mg total) by mouth daily.    Dispense:  30 capsule    Refill:  0    Supervising Provider:   ALPHONSA GLENDIA LABOR [phone]   Patient already has orders for repeat labs per neurologist.  Will wait to see if her electrolytes returned to normal.  Reassured there is nothing emergent on labs at this time. Discussed increasing duloxetine  for anxiety as well as possibly helping nerve pain.  Will increase to 30 mg daily.  Go back to 20 mg dose and contact office if any significant adverse effects. Recommend follow-up in February for a physical with labs including lipid and liver profiles, possibly other labs depending on results of upcoming test.

## 2023-09-14 ENCOUNTER — Telehealth: Payer: Self-pay | Admitting: Neurology

## 2023-09-14 NOTE — Telephone Encounter (Signed)
 Pt cancelled appointment. Have some things going on, will call back to reschedule

## 2023-09-23 DIAGNOSIS — G500 Trigeminal neuralgia: Secondary | ICD-10-CM | POA: Diagnosis not present

## 2023-10-03 ENCOUNTER — Other Ambulatory Visit: Payer: Self-pay | Admitting: Nurse Practitioner

## 2023-10-05 ENCOUNTER — Encounter: Payer: Self-pay | Admitting: Nurse Practitioner

## 2023-10-06 ENCOUNTER — Other Ambulatory Visit: Payer: Self-pay | Admitting: Nurse Practitioner

## 2023-10-06 DIAGNOSIS — I10 Essential (primary) hypertension: Secondary | ICD-10-CM

## 2023-10-06 DIAGNOSIS — E785 Hyperlipidemia, unspecified: Secondary | ICD-10-CM

## 2023-10-06 DIAGNOSIS — Z79899 Other long term (current) drug therapy: Secondary | ICD-10-CM

## 2023-10-09 ENCOUNTER — Other Ambulatory Visit: Payer: Self-pay | Admitting: Family Medicine

## 2023-10-10 ENCOUNTER — Other Ambulatory Visit: Payer: Self-pay

## 2023-10-10 DIAGNOSIS — I10 Essential (primary) hypertension: Secondary | ICD-10-CM | POA: Diagnosis not present

## 2023-10-10 DIAGNOSIS — Z79899 Other long term (current) drug therapy: Secondary | ICD-10-CM | POA: Diagnosis not present

## 2023-10-10 DIAGNOSIS — E785 Hyperlipidemia, unspecified: Secondary | ICD-10-CM | POA: Diagnosis not present

## 2023-10-10 MED ORDER — ROSUVASTATIN CALCIUM 20 MG PO TABS: 20.0000 mg | ORAL_TABLET | Freq: Every day | ORAL | 1 refills | Status: DC

## 2023-10-11 LAB — LIPID PANEL
Chol/HDL Ratio: 1.8 {ratio} (ref 0.0–4.4)
Cholesterol, Total: 148 mg/dL (ref 100–199)
HDL: 82 mg/dL (ref >39)
LDL Chol Calc (NIH): 54 mg/dL (ref 0–99)
Triglycerides: 54 mg/dL (ref 0–149)
VLDL Cholesterol Cal: 12 mg/dL (ref 5–40)

## 2023-10-11 LAB — CBC WITH DIFFERENTIAL/PLATELET
Basophils Absolute: 0.1 10*3/uL (ref 0.0–0.2)
Basos: 1 %
EOS (ABSOLUTE): 0.2 10*3/uL (ref 0.0–0.4)
Eos: 3 %
Hematocrit: 35.5 % (ref 34.0–46.6)
Hemoglobin: 12.3 g/dL (ref 11.1–15.9)
Immature Grans (Abs): 0 10*3/uL (ref 0.0–0.1)
Immature Granulocytes: 0 %
Lymphocytes Absolute: 1.9 10*3/uL (ref 0.7–3.1)
Lymphs: 28 %
MCH: 33.3 pg — ABNORMAL HIGH (ref 26.6–33.0)
MCHC: 34.6 g/dL (ref 31.5–35.7)
MCV: 96 fL (ref 79–97)
Monocytes Absolute: 0.7 10*3/uL (ref 0.1–0.9)
Monocytes: 10 %
Neutrophils Absolute: 4 10*3/uL (ref 1.4–7.0)
Neutrophils: 58 %
Platelets: 200 10*3/uL (ref 150–450)
RBC: 3.69 x10E6/uL — ABNORMAL LOW (ref 3.77–5.28)
RDW: 12.3 % (ref 11.7–15.4)
WBC: 6.9 10*3/uL (ref 3.4–10.8)

## 2023-10-13 ENCOUNTER — Ambulatory Visit: Payer: HMO | Admitting: Nurse Practitioner

## 2023-10-13 ENCOUNTER — Encounter: Payer: Self-pay | Admitting: Nurse Practitioner

## 2023-10-13 VITALS — BP 123/78 | HR 80 | Temp 98.1°F | Ht 59.0 in | Wt 112.0 lb

## 2023-10-13 DIAGNOSIS — Z0000 Encounter for general adult medical examination without abnormal findings: Secondary | ICD-10-CM | POA: Diagnosis not present

## 2023-10-13 DIAGNOSIS — Z23 Encounter for immunization: Secondary | ICD-10-CM | POA: Diagnosis not present

## 2023-10-13 NOTE — Progress Notes (Signed)
Subjective:    Patient ID: Regina Neal, female    DOB: August 20, 1956, 68 y.o.   MRN: [id number]  HPI Presents for her annual physical.  Gets GYN exams, bone density scans and mammograms through her gynecologist.  Diet overall healthy, tries to limit fast foods.  Activity has been somewhat restricted since bunion surgery last February.  No new sexual partners.  No vaginal bleeding.  Request Prevnar 20 vaccine today.  Regular follow-up with neurology.  Gets regular vision and dental exams. Last bone density was 12/28/2022.  Osteoporosis was noted at the left femur.  Patient was prescribed Fosamax by her gynecologist but after she read about the potential side effects decided to hold off on this.   Review of Systems  Constitutional:  Negative for activity change, appetite change and fatigue.  HENT:  Negative for sore throat and trouble swallowing.   Respiratory:  Negative for cough, chest tightness, shortness of breath and wheezing.   Cardiovascular:  Negative for chest pain.  Gastrointestinal:  Negative for abdominal distention, abdominal pain, constipation, diarrhea, nausea and vomiting.  Genitourinary:  Negative for difficulty urinating, dysuria, frequency, urgency and vaginal bleeding.      10/13/2023   10:49 AM  Depression screen PHQ 2/9  Decreased Interest 1  Down, Depressed, Hopeless 1  PHQ - 2 Score 2  Altered sleeping 1  Tired, decreased energy 0  Change in appetite 0  Feeling bad or failure about yourself  0  Trouble concentrating 1  Moving slowly or fidgety/restless 0  Suicidal thoughts 0  PHQ-9 Score 4  Difficult doing work/chores Somewhat difficult      10/13/2023   10:48 AM 09/06/2023    3:49 PM 07/27/2022    2:33 PM  GAD 7 : Generalized Anxiety Score  Nervous, Anxious, on Edge 1 1 1   Control/stop worrying 0 1 0  Worry too much - different things 0 2 0  Trouble relaxing 1 2 0  Restless 0 1 0  Easily annoyed or irritable 0 1 0  Afraid - awful might happen 0 2 0   Total GAD 7 Score 2 10 1   Anxiety Difficulty Somewhat difficult Somewhat difficult Not difficult at all         Objective:   Physical Exam Vitals and nursing note reviewed.  Constitutional:      General: She is not in acute distress.    Appearance: She is well-developed.  Neck:     Thyroid: No thyromegaly.     Trachea: No tracheal deviation.     Comments: Thyroid non tender to palpation. No mass or goiter noted.  Cardiovascular:     Rate and Rhythm: Normal rate and regular rhythm.     Heart sounds: Normal heart sounds. No murmur heard. Pulmonary:     Effort: Pulmonary effort is normal.     Breath sounds: Normal breath sounds.  Abdominal:     General: There is no distension.     Palpations: Abdomen is soft.     Tenderness: There is no abdominal tenderness.  Genitourinary:    Comments: Defers GU and breast exams, done through gynecology. Musculoskeletal:     Cervical back: Normal range of motion and neck supple.  Lymphadenopathy:     Cervical: No cervical adenopathy.     Upper Body:     Right upper body: No supraclavicular adenopathy.     Left upper body: No supraclavicular adenopathy.  Skin:    General: Skin is warm and dry.  Neurological:  Mental Status: She is alert and oriented to person, place, and time.  Psychiatric:        Mood and Affect: Mood normal.        Behavior: Behavior normal.        Thought Content: Thought content normal.        Judgment: Judgment normal.      Results for orders placed or performed in visit on 10/06/23  CBC with Differential/Platelet   Collection Time: 10/10/23  9:37 AM  Result Value Ref Range   WBC 6.9 3.4 - 10.8 x10E3/uL   RBC 3.69 (L) 3.77 - 5.28 x10E6/uL   Hemoglobin 12.3 11.1 - 15.9 g/dL   Hematocrit 16.1 09.6 - 46.6 %   MCV 96 79 - 97 fL   MCH 33.3 (H) 26.6 - 33.0 pg   MCHC 34.6 31.5 - 35.7 g/dL   RDW 04.5 40.9 - 81.1 %   Platelets 200 150 - 450 x10E3/uL   Neutrophils 58 Not Estab. %   Lymphs 28 Not Estab. %    Monocytes 10 Not Estab. %   Eos 3 Not Estab. %   Basos 1 Not Estab. %   Neutrophils Absolute 4.0 1.4 - 7.0 x10E3/uL   Lymphocytes Absolute 1.9 0.7 - 3.1 x10E3/uL   Monocytes Absolute 0.7 0.1 - 0.9 x10E3/uL   EOS (ABSOLUTE) 0.2 0.0 - 0.4 x10E3/uL   Basophils Absolute 0.1 0.0 - 0.2 x10E3/uL   Immature Granulocytes 0 Not Estab. %   Immature Grans (Abs) 0.0 0.0 - 0.1 x10E3/uL  Lipid panel   Collection Time: 10/10/23  9:37 AM  Result Value Ref Range   Cholesterol, Total 148 100 - 199 mg/dL   Triglycerides 54 0 - 149 mg/dL   HDL 82 >91 mg/dL   VLDL Cholesterol Cal 12 5 - 40 mg/dL   LDL Chol Calc (NIH) 54 0 - 99 mg/dL   Chol/HDL Ratio 1.8 0.0 - 4.4 ratio   Patient has brought copy of her labs with her today.  Her metabolic profile is normal.  Print is too small for scanning.     Assessment & Plan:  Annual physical exam  Immunization due - Plan: Pneumococcal conjugate vaccine 20-valent  Prevnar 20 given today. Encouraged exercises that can be done while sitting such as chair yoga. Encourage patient to reconsider taking Fosamax due to osteoporosis.  Cautioned about common side effects.  Discontinue medication and contact the provider if any problems.  If she cannot tolerate Fosamax recommend referral to endocrinology to discuss injectable. Follow-up with podiatry if she continues to have foot problems related to her bunion surgery. Return in about 1 year (around 10/12/2024).

## 2023-10-15 ENCOUNTER — Encounter: Payer: Self-pay | Admitting: Nurse Practitioner

## 2023-10-24 IMAGING — XA DG INJECT/[PERSON_NAME] INC NEEDLE/CATH/PLC EPI/CERV/THOR W/IMG
2 series · 2 of 2 positions shown · non-contrast
Comparison: none

CLINICAL DATA: Cervical spondylosis without myelopathy with
radiculopathy. Good relief for 3.5 weeks following the initial
pain has subsequently returned.

[Series 1: ortho standard · 1 of 1 slices shown (1 of 2)]
[im 1/1]
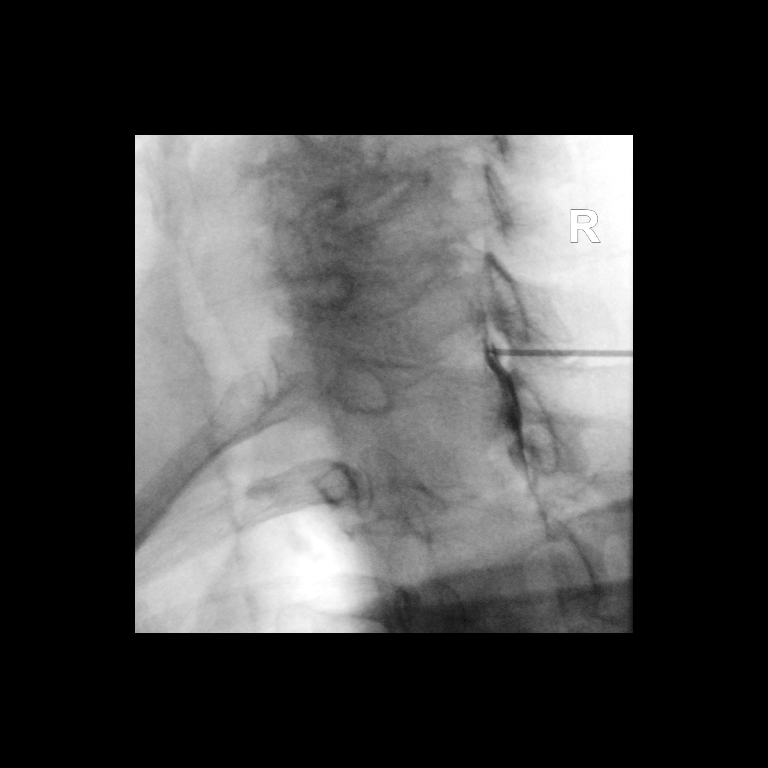

[Series 2: ortho standard · 1 of 1 slices shown (2 of 2)]
[im 1/1]
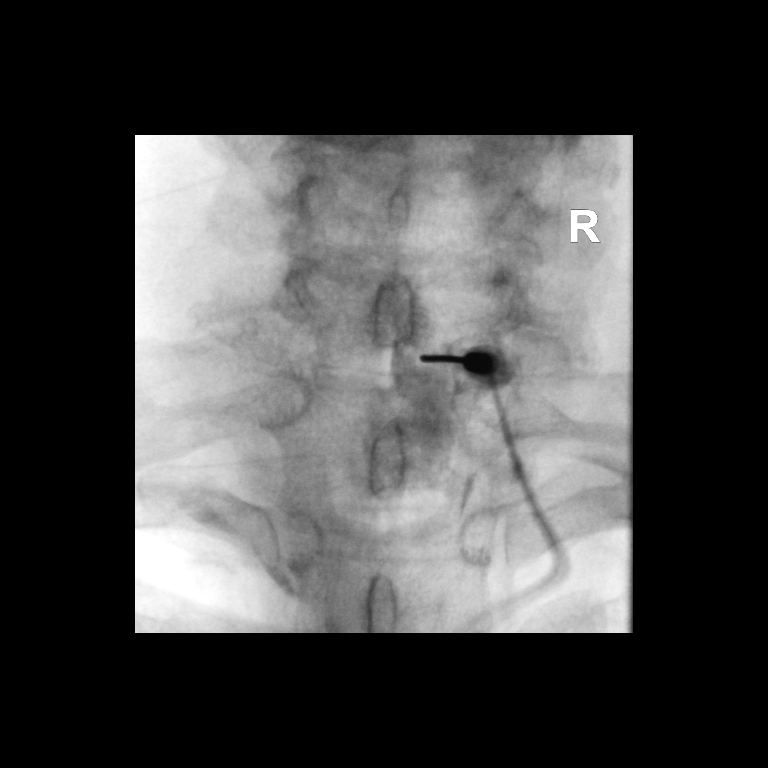

[2 of 2 positions shown; findings below may reference images not displayed]

FLUOROSCOPY:
Radiation Exposure Index (as provided by the fluoroscopic device):
1.0 mGy Kerma

PROCEDURE:
The procedure, risks, benefits, and alternatives were explained to
the patient. Questions regarding the procedure were encouraged and
answered. The patient understands and consents to the procedure.

CERVICAL EPIDURAL INJECTION

An interlaminar approach was performed on the right at C7-T1. A
inch 20 gauge epidural needle was advanced using loss-of-resistance
technique.

DIAGNOSTIC EPIDURAL INJECTION

Injection of Isovue-M 300 shows a good epidural pattern with spread
above and below the level of needle placement, primarily on the
right. No vascular opacification is seen.

THERAPEUTIC EPIDURAL INJECTION

1.5 ml of Kenalog 40 mixed with 2 ml of normal saline were then
instilled. The procedure was well-tolerated, and the patient was
discharged thirty minutes following the injection in good condition.
IMPRESSION: Technically successful interlaminar epidural injection on the right
at C7-T1.

## 2023-11-10 ENCOUNTER — Ambulatory Visit: Payer: PPO | Admitting: Neurology

## 2023-11-13 ENCOUNTER — Other Ambulatory Visit: Payer: Self-pay | Admitting: Family Medicine

## 2023-11-14 ENCOUNTER — Other Ambulatory Visit: Payer: Self-pay

## 2023-11-14 MED ORDER — AMLODIPINE BESYLATE 10 MG PO TABS: 10.0000 mg | ORAL_TABLET | Freq: Every day | ORAL | 3 refills | Status: AC

## 2023-11-16 ENCOUNTER — Other Ambulatory Visit: Payer: Self-pay | Admitting: Family Medicine

## 2023-11-29 DIAGNOSIS — H469 Unspecified optic neuritis: Secondary | ICD-10-CM | POA: Diagnosis not present

## 2023-11-29 DIAGNOSIS — G500 Trigeminal neuralgia: Secondary | ICD-10-CM | POA: Diagnosis not present

## 2023-12-05 DIAGNOSIS — M5481 Occipital neuralgia: Secondary | ICD-10-CM | POA: Diagnosis not present

## 2023-12-05 DIAGNOSIS — G500 Trigeminal neuralgia: Secondary | ICD-10-CM | POA: Diagnosis not present

## 2023-12-11 ENCOUNTER — Other Ambulatory Visit: Payer: Self-pay | Admitting: Family Medicine

## 2023-12-26 DIAGNOSIS — M5481 Occipital neuralgia: Secondary | ICD-10-CM | POA: Diagnosis not present

## 2024-01-11 ENCOUNTER — Encounter: Payer: Self-pay | Admitting: Family Medicine

## 2024-01-12 ENCOUNTER — Other Ambulatory Visit: Payer: Self-pay | Admitting: Family Medicine

## 2024-01-12 ENCOUNTER — Telehealth: Payer: Self-pay | Admitting: *Deleted

## 2024-01-12 MED ORDER — CLONAZEPAM 0.5 MG PO TABS: 0.5000 mg | ORAL_TABLET | Freq: Every evening | ORAL | 3 refills | Status: DC | PRN

## 2024-01-12 NOTE — Telephone Encounter (Unsigned)
 Copied from CRM [id number]. Topic: Clinical - Medication Question >> Jan 12, 2024 12:07 PM Hamp Levine R wrote: Reason for CRM: Patient requested a refill Clonazepam  .5mg  on her MyChart last night. Would like for someone in the office to contact her.  Patient can be reached at [phone]

## 2024-01-13 NOTE — Telephone Encounter (Signed)
 Patient notified

## 2024-01-13 NOTE — Telephone Encounter (Signed)
 Cook, Jayce G, DO     Rx sent in today.

## 2024-01-17 DIAGNOSIS — H0589 Other disorders of orbit: Secondary | ICD-10-CM | POA: Diagnosis not present

## 2024-01-17 DIAGNOSIS — R220 Localized swelling, mass and lump, head: Secondary | ICD-10-CM | POA: Diagnosis not present

## 2024-02-20 ENCOUNTER — Other Ambulatory Visit: Payer: Self-pay | Admitting: Family Medicine

## 2024-03-09 ENCOUNTER — Ambulatory Visit (INDEPENDENT_AMBULATORY_CARE_PROVIDER_SITE_OTHER): Payer: PPO

## 2024-03-09 VITALS — Ht 59.0 in | Wt 112.0 lb

## 2024-03-09 DIAGNOSIS — Z0000 Encounter for general adult medical examination without abnormal findings: Secondary | ICD-10-CM

## 2024-03-09 NOTE — Patient Instructions (Signed)
 [name] , Thank you for taking time out of your busy schedule to complete your Annual Wellness Visit with me. I enjoyed our conversation and look forward to speaking with you again next year. I, as well as your care team,  appreciate your ongoing commitment to your health goals. Please review the following plan we discussed and let me know if I can assist you in the future. Your Game plan/ To Do List    Follow up Visits: Next Medicare AWV with our clinical staff: In 1 year    Have you seen your provider in the last 6 months (3 months if uncontrolled diabetes)? Yes Next Office Visit with your provider: 10/15/24 @ 1:10  Clinician Recommendations:  Aim for 30 minutes of exercise or brisk walking, 6-8 glasses of water, and 5 servings of fruits and vegetables each day.       This is a list of the screening recommended for you and due dates:  Health Maintenance  Topic Date Due   COVID-19 Vaccine (7 - Mixed Product risk 2024-25 season) 12/28/2023   Colon Cancer Screening  10/12/2024*   Zoster (Shingles) Vaccine (1 of 2) 10/12/2024*   Flu Shot  03/30/2024   Mammogram  07/08/2024   Medicare Annual Wellness Visit  03/09/2025   DTaP/Tdap/Td vaccine (3 - Td or Tdap) 08/26/2033   Pneumococcal Vaccine for age over 45  Completed   DEXA scan (bone density measurement)  Completed   Hepatitis C Screening  Completed   Hepatitis B Vaccine  Aged Out   HPV Vaccine  Aged Out   Meningitis B Vaccine  Aged Out  *Topic was postponed. The date shown is not the original due date.    Advanced directives: (ACP Link)Information on Advanced Care Planning can be found at [place]  Secretary of [facility] Advance Health Care Directives Advance Health Care Directives. [email or web address]   Advance Care Planning is important because it:  [x]  Makes sure you receive the medical care that is consistent with your values, goals, and preferences  [x]  It provides guidance to your family and loved ones and reduces their decisional  burden about whether or not they are making the right decisions based on your wishes.  Follow the link provided in your after visit summary or read over the paperwork we have mailed to you to help you started getting your Advance Directives in place. If you need assistance in completing these, please reach out to us  so that we can help you!  See attachments for Preventive Care and Fall Prevention Tips.

## 2024-03-09 NOTE — Progress Notes (Signed)
 Subjective:   Regina Neal is a 68 y.o. who presents for a Medicare Wellness preventive visit.  As a reminder, Annual Wellness Visits don't include a physical exam, and some assessments may be limited, especially if this visit is performed virtually. We may recommend an in-person follow-up visit with your provider if needed.  Visit Complete: Virtual I connected with  JANET HUMPHREYS on 03/09/24 by a audio enabled telemedicine application and verified that I am speaking with the correct person using two identifiers.  Patient Location: Home  Provider Location: Home Office  I discussed the limitations of evaluation and management by telemedicine. The patient expressed understanding and agreed to proceed.  Vital Signs: Because this visit was a virtual/telehealth visit, some criteria may be missing or patient reported. Any vitals not documented were not able to be obtained and vitals that have been documented are patient reported.  VideoDeclined- This patient declined Librarian, academic. Therefore the visit was completed with audio only.  Persons Participating in Visit: Patient.  AWV Questionnaire: Yes: Patient Medicare AWV questionnaire was completed by the patient on 03/08/24; I have confirmed that all information answered by patient is correct and no changes since this date.  Cardiac Risk Factors include: advanced age (>69men, >81 women);dyslipidemia;hypertension     Objective:    Today's Vitals   03/09/24 1350  Weight: 112 lb (50.8 kg)  Height: 4' 11 (1.499 m)   Body mass index is 22.62 kg/m.     03/09/2024    1:53 PM 08/27/2023   11:42 AM 02/25/2023    2:35 PM 04/14/2021    2:58 PM  Advanced Directives  Does Patient Have a Medical Advance Directive? No No No Yes  Does patient want to make changes to medical advance directive?    No - Patient declined  Would patient like information on creating a medical advance directive? Yes  (MAU/Ambulatory/Procedural Areas - Information given)  No - Patient declined     Current Medications (verified) Outpatient Encounter Medications as of 03/09/2024  Medication Sig   amLODipine  (NORVASC ) 10 MG tablet Take 1 tablet (10 mg total) by mouth daily.   Ascorbic Acid (VITAMIN C) 500 MG CAPS Take 1 capsule by mouth every other day.   aspirin EC 81 MG tablet Take 81 mg by mouth daily. Swallow whole.   baclofen  (LIORESAL ) 20 MG tablet TAKE 1 TABLET BY MOUTH THREE TIMES DAILY   calcium  carbonate (OSCAL) 1500 (600 Ca) MG TABS tablet Take by mouth daily with breakfast.   cholecalciferol (VITAMIN D) 1000 units tablet Take 2,000 Units by mouth daily.   clonazePAM  (KLONOPIN ) 0.5 MG tablet Take 1 tablet (0.5 mg total) by mouth at bedtime as needed.   DULoxetine  (CYMBALTA ) 30 MG capsule Take 1 capsule by mouth once daily   gabapentin  (NEURONTIN ) 600 MG tablet Take 1 tablet (600 mg total) by mouth 4 (four) times daily.   Multiple Vitamin (MULTI-VITAMIN DAILY) TABS Take 1 tablet by mouth every other day.   NON FORMULARY Apply 1 patch topically as needed. Lidoderm  patch   Omega-3 Fatty Acids (FISH OIL OMEGA-3 PO) Take 1 tablet by mouth every other day.   omeprazole  (PRILOSEC) 20 MG capsule Take 1 capsule by mouth once daily   OVER THE COUNTER MEDICATION B 12 1000 mcg 1 Q day   rizatriptan  (MAXALT ) 10 MG tablet Take 1 tablet (10 mg total) by mouth as needed for migraine. May repeat in 2 hours if needed   rosuvastatin  (CRESTOR ) 20  MG tablet Take 1 tablet (20 mg total) by mouth daily.   vitamin E 180 MG (400 UNITS) capsule Take 1 capsule by mouth every other day.   Facility-Administered Encounter Medications as of 03/09/2024  Medication   triamcinolone  acetonide (KENALOG -40) injection 10 mg    Allergies (verified) Buprenorphine and Verapamil   History: Past Medical History:  Diagnosis Date   Allergy 2018   Anxiety    Asthma    exercised induced   Cataract    Hypertension    Multiple  sclerosis (HCC)    Osteoporosis 2024   Stroke [facility])    Trigeminal neuralgia    Past Surgical History:  Procedure Laterality Date   EYE SURGERY  2024   Cataracts   INSERTION / PLACEMENT / REVISION NEUROSTIMULATOR Right 2015   TUBAL LIGATION     Family History  Problem Relation Age of Onset   Cancer Mother    Heart disease Mother    Lung cancer Father    Cancer Father    Stroke Maternal Grandmother    Social History   Socioeconomic History   Marital status: Legally Separated    Spouse name: Not on file   Number of children: 2   Years of education: Not on file   Highest education level: 12th grade  Occupational History   Not on file  Tobacco Use   Smoking status: Never   Smokeless tobacco: Never  Vaping Use   Vaping status: Never Used  Substance and Sexual Activity   Alcohol use: Never   Drug use: Never   Sexual activity: Not Currently    Birth control/protection: Surgical  Other Topics Concern   Not on file  Social History Narrative   Lives alone   Right handed   Caffeine: 2 cups of coffee and 1 glass of tea, and half a soda a day   Social Drivers of Corporate investment banker Strain: Low Risk  (03/08/2024)   Overall Financial Resource Strain (CARDIA)    Difficulty of Paying Living Expenses: Not hard at all  Food Insecurity: No Food Insecurity (03/08/2024)   Hunger Vital Sign    Worried About Running Out of Food in the Last Year: Never true    Ran Out of Food in the Last Year: Never true  Transportation Needs: No Transportation Needs (03/08/2024)   PRAPARE - Administrator, Civil Service (Medical): No    Lack of Transportation (Non-Medical): No  Physical Activity: Sufficiently Active (03/08/2024)   Exercise Vital Sign    Days of Exercise per Week: 4 days    Minutes of Exercise per Session: 120 min  Stress: No Stress Concern Present (02/25/2023)   [employer] of Occupational Health - Occupational Stress Questionnaire    Feeling of Stress :  Only a little  Social Connections: Moderately Integrated (03/08/2024)   Social Connection and Isolation Panel    Frequency of Communication with Friends and Family: More than three times a week    Frequency of Social Gatherings with Friends and Family: Twice a week    Attends Religious Services: 1 to 4 times per year    Active Member of [employer] or Organizations: Yes    Attends Engineer, structural: More than 4 times per year    Marital Status: Separated    Tobacco Counseling Counseling given: Not Answered    Clinical Intake:  Pre-visit preparation completed: Yes  Pain : No/denies pain     Diabetes: No  No results found  for: HGBA1C   How often do you need to have someone help you when you read instructions, pamphlets, or other written materials from your doctor or pharmacy?: 1 - Never  Interpreter Needed?: No  Information entered by :: Charmaine Bloodgood LPN   Activities of Daily Living     03/09/2024    1:53 PM  In your present state of health, do you have any difficulty performing the following activities:  Hearing? 0  Vision? 0  Difficulty concentrating or making decisions? 0  Walking or climbing stairs? 0  Dressing or bathing? 0  Doing errands, shopping? 0  Preparing Food and eating ? N  Using the Toilet? N  In the past six months, have you accidently leaked urine? N  Do you have problems with loss of bowel control? N  Managing your Medications? N  Managing your Finances? N  Housekeeping or managing your Housekeeping? N    Patient Care Team: Cook, Jayce G, DO as PCP - General (Family Medicine) Okey Leader, MD as Consulting Physician (Obstetrics and Gynecology) Huey, Jama Farrow, MD as Referring Physician (Neurology) [facility], [facility]   I have updated your Care Teams any recent Medical Services you may have received from other providers in the past year.     Assessment:   This is a routine wellness examination for  Harman.  Hearing/Vision screen Hearing Screening - Comments:: Denies hearing difficulties   Vision Screening - Comments:: Wears rx glasses - up to date with routine eye exams with [facility].    Goals Addressed             This Visit's Progress    Remain active and independent   On track      Depression Screen     03/09/2024    1:52 PM 10/13/2023   10:49 AM 09/06/2023    3:48 PM 02/25/2023    2:43 PM 09/21/2022   11:24 AM 07/27/2022    2:33 PM  PHQ 2/9 Scores  PHQ - 2 Score 2 2 3  0 0 1  PHQ- 9 Score 4 4 6   3     Fall Risk     03/09/2024    1:53 PM 10/13/2023   10:48 AM 10/13/2023   10:40 AM 02/25/2023    2:42 PM 02/22/2023   10:39 AM  Fall Risk   Falls in the past year? 0 1 1 0 0  Number falls in past yr: 0 0 0 0   Injury with Fall? 0 1 1 0   Risk for fall due to : No Fall Risks   No Fall Risks   Follow up Falls prevention discussed;Education provided;Falls evaluation completed   Falls prevention discussed     MEDICARE RISK AT HOME:  Medicare Risk at Home Any stairs in or around the home?: Yes If so, are there any without handrails?: No Home free of loose throw rugs in walkways, pet beds, electrical cords, etc?: Yes Adequate lighting in your home to reduce risk of falls?: Yes Life alert?: No Use of a cane, walker or w/c?: No Grab bars in the bathroom?: Yes Shower chair or bench in shower?: No Elevated toilet seat or a handicapped toilet?: Yes  TIMED UP AND GO:  Was the test performed?  No  Cognitive Function: Declined/Normal: No cognitive concerns noted by patient or family. Patient alert, oriented, able to answer questions appropriately and recall recent events. No signs of memory loss or confusion.  02/25/2023    2:40 PM  6CIT Screen  What Year? 0 points  What month? 0 points  What time? 0 points  Count back from 20 0 points  Months in reverse 0 points  Repeat phrase 0 points  Total Score 0 points    Immunizations Immunization History   Administered Date(s) Administered   Fluad Quad(high Dose 65+) 06/14/2019, 06/29/2023   Influenza, High Dose Seasonal PF 07/26/2022   Influenza-Unspecified 07/08/2015   Moderna Sars-Covid-2 Vaccination 10/03/2019, 10/31/2019, 07/02/2020   Novel Infuenza-h1n1-09 08/27/2008   PNEUMOCOCCAL CONJUGATE-20 10/13/2023   Pneumococcal Polysaccharide-23 02/09/2005, 09/24/2020   Td (Adult),5 Lf Tetanus Toxid, Preservative Free 02/09/2005   Tdap 06/08/2012, 08/27/2023   Unspecified SARS-COV-2 Vaccination 10/03/2019, 10/31/2019, 06/29/2023   Zoster, Live 01/07/2014    Screening Tests Health Maintenance  Topic Date Due   COVID-19 Vaccine (7 - Mixed Product risk 2024-25 season) 12/28/2023   Colonoscopy  10/12/2024 (Originally 09/04/2023)   Zoster Vaccines- Shingrix (1 of 2) 10/12/2024 (Originally 12/05/1974)   INFLUENZA VACCINE  03/30/2024   MAMMOGRAM  07/08/2024   Medicare Annual Wellness (AWV)  03/09/2025   DTaP/Tdap/Td (3 - Td or Tdap) 08/26/2033   Pneumococcal Vaccine: 50+ Years  Completed   DEXA SCAN  Completed   Hepatitis C Screening  Completed   Hepatitis B Vaccines  Aged Out   HPV VACCINES  Aged Out   Meningococcal B Vaccine  Aged Out    Health Maintenance  Health Maintenance Due  Topic Date Due   COVID-19 Vaccine (7 - Mixed Product risk 2024-25 season) 12/28/2023   Health Maintenance Items Addressed: Request sent to gyn for last mammogram results   Additional Screening:  Vision Screening: Recommended annual ophthalmology exams for early detection of glaucoma and other disorders of the eye. Would you like a referral to an eye doctor? No    Dental Screening: Recommended annual dental exams for proper oral hygiene  Community Resource Referral / Chronic Care Management: CRR required this visit?  No   CCM required this visit?  No   Plan:    I have personally reviewed and noted the following in the patient's chart:   Medical and social history Use of alcohol, tobacco or  illicit drugs  Current medications and supplements including opioid prescriptions. Patient is not currently taking opioid prescriptions. Functional ability and status Nutritional status Physical activity Advanced directives List of other physicians Hospitalizations, surgeries, and ER visits in previous 12 months Vitals Screenings to include cognitive, depression, and falls Referrals and appointments  In addition, I have reviewed and discussed with patient certain preventive protocols, quality metrics, and best practice recommendations. A written personalized care plan for preventive services as well as general preventive health recommendations were provided to patient.   Lavelle Pfeiffer [place], [place]   [id number]/7974   After Visit Summary: (MyChart) Due to this being a telephonic visit, the after visit summary with patients personalized plan was offered to patient via MyChart   Notes: Nothing significant to report at this time.

## 2024-03-20 DIAGNOSIS — H04123 Dry eye syndrome of bilateral lacrimal glands: Secondary | ICD-10-CM | POA: Diagnosis not present

## 2024-03-26 ENCOUNTER — Other Ambulatory Visit: Payer: Self-pay | Admitting: Nurse Practitioner

## 2024-03-27 DIAGNOSIS — R519 Headache, unspecified: Secondary | ICD-10-CM | POA: Diagnosis not present

## 2024-04-11 ENCOUNTER — Ambulatory Visit: Payer: Self-pay

## 2024-04-11 NOTE — Telephone Encounter (Signed)
 FYI Only or Action Required?: Action required by provider: request for appointment.  Patient was last seen in primary care on 10/13/2023 by Mauro Elveria BROCKS, NP.  Called Nurse Triage reporting Headache.  Symptoms began several weeks ago.  Interventions attempted: Nothing.  Symptoms are: unchanged.  Triage Disposition: See PCP When Office is Open (Within 3 Days)  Patient/caregiver understands and will follow disposition?: YesCopied from CRM #[id number]. Topic: Clinical - Red Word Triage >> Apr 11, 2024  3:08 PM Selinda RAMAN wrote: Red Word that prompted transfer to Nurse Triage: The patient states she has been dealing with severe pain which has been getting owrse over the last few weeks. 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 She laso has seen multiple Neurologist with the most recent one with [facility] and she feels like he is not listening or helping much. With her pain in her head and it getting worse I will transfer her to E2C[facility] NT. Reason for Disposition  [1] MILD-MODERATE headache AND [2] present > 3 days (72 hours) AND [3] no improvement after using Care Advice  Answer Assessment - Initial Assessment Questions Pt has occipital neuralgia but states this is different. Pt had nerve block on 03/27/24 and it didn't help. Pt stated this is stabbing pain in eyebrows. Pt said it is similar to  a headache. Pt requested to see NP [facility] Friday.        1. LOCATION: Where does it hurt?      Behind eyes and eyebrow area 2. ONSET: When did the headache start? (e.g., minutes, hours, days)      2 weeks 3. PATTERN: Does the pain come and go, or has it been constant since it started?     Comes and goes 4. SEVERITY: How bad is the pain? and What does it keep you from doing?  (e.g., Scale 1-10; mild, moderate, or severe)     Severe  5. RECURRENT SYMPTOM: Have you ever had headaches before? If Yes, ask:  When was the last time? and What happened that time?      na 6. CAUSE: What do you think is causing the headache?     Not sure 7. MIGRAINE: Have you been diagnosed with migraine headaches? If Yes, ask: Is this headache similar?      yes 8. HEAD INJURY: Has there been any recent injury to your head?      denies 9. OTHER SYMPTOMS: Do you have any other symptoms? (e.g., fever, stiff neck, eye pain, sore throat, cold symptoms)     Eyebrows are swollen  Protocols used: Headache-A-AH

## 2024-04-13 ENCOUNTER — Ambulatory Visit: Admitting: Nurse Practitioner

## 2024-04-13 VITALS — BP 133/78 | HR 79 | Temp 97.5°F | Ht 59.0 in | Wt 108.0 lb

## 2024-04-13 DIAGNOSIS — G500 Trigeminal neuralgia: Secondary | ICD-10-CM

## 2024-04-13 MED ORDER — PREDNISONE 20 MG PO TABS: ORAL_TABLET | ORAL | 0 refills | Status: AC

## 2024-04-13 NOTE — Progress Notes (Signed)
 Subjective:    Patient ID: Regina Neal, female    DOB: 1956/01/31, 68 y.o.   MRN: [id number]  HPI Discussed the use of AI scribe software for clinical note transcription with the patient, who gave verbal consent to proceed.  History of Present Illness Regina Neal is a 68 year old female with neuralgia who presents with worsening pain and facial spasms.  She has been experiencing worsening occipital neuralgia. Despite undergoing two nerve blocks, she has not found relief. A CT scan on May 20th and an MRI on September 19th showed no significant findings. She is currently prescribed indomethacin 25 mg once daily for seven days but is concerned about potential side effects, including stomach issues and effects on kidney function. Her kidney function was recently checked and is normal.  She experiences severe pain and spasms on the right side of her face, described as 'all spasming' and affecting her sleep. The pain extends from her head to her lips and is tender to touch. She has been using a prescription topical cream that provides some relief. Additionally, she reports swelling above her right eye, which has been present for about a month.  Her current medications include gabapentin  and duloxetine  for nerve pain, and baclofen  for spasms. She is on a high dose of gabapentin  and has been taking these medications for a long time. She also takes omeprazole  for irritable bowel, which may help mitigate potential stomach issues from indomethacin.  She has a history of trigeminal neuralgia for nearly thirty years, which has been consistently present but never as painful as currently experienced. No vision changes, difficulty speaking, or swallowing issues. She experiences ringing in her ears, though she cannot specify which ear. She is concerned about the possibility of having another stroke due to the severity of her symptoms.   Review of Systems See HPI    Objective:   Physical  Exam Constitutional:      General: She is not in acute distress. Eyes:     Extraocular Movements: Extraocular movements intact.     Pupils: Pupils are equal, round, and reactive to light.  Cardiovascular:     Rate and Rhythm: Normal rate and regular rhythm.  Pulmonary:     Effort: Pulmonary effort is normal.     Breath sounds: Normal breath sounds.  Musculoskeletal:     Cervical back: Neck supple.  Neurological:     Mental Status: She is alert and oriented to person, place, and time.     Sensory: No sensory deficit.     Motor: No weakness.     Gait: Gait normal.     Deep Tendon Reflexes: Reflexes normal.  Psychiatric:        Mood and Affect: Mood normal. Mood is not anxious or depressed.        Speech: Speech normal.        Behavior: Behavior normal. Behavior is not agitated.        Cognition and Memory: Cognition is not impaired. Memory is not impaired.   Mild edema over the right eye along the brow. Non tender to palpation. Tenderness noted along the right upper face and along the right parietal area. No lesions noted. Minimal lid lag noted on the right lid compared to the left.  Today's Vitals   04/13/24 1129 04/13/24 1130  BP: 133/78 133/78  Pulse:  79  Temp:  (!) 97.5 F (36.4 C)  SpO2:  98%  Weight:  108 lb (49 kg)  Height:  4' 11 (1.499 m)   Body mass index is 21.81 kg/m.          Assessment & Plan:  Assessment and Plan Assessment & Plan  Problem List Items Addressed This Visit       Nervous and Auditory   Trigeminal neuralgia - Primary     Trigeminal neuralgia with severe flare Possible element of occipital neuralgia adding to symptoms Severe flare with right-sided facial pain and swelling, likely nerve involvement. Previous gabapentin  and duloxetine  provided limited relief. Differential includes occipital neuralgia and temporal arteritis, latter less likely. - Prescribed oral prednisone  60 mg daily, taper as needed. Reduce to 40 mg if 60 mg is  excessive. - Advised against NSAIDs during prednisone  treatment. - Recommended ice or heat applications for relief. - Encouraged use of topical cream for pain relief. - Monitor response to prednisone , consider indomethacin post-treatment if symptoms persist. - Advised follow-up via MyChart for treatment efficacy and symptom changes.  Return if symptoms worsen or fail to improve. Follow up with neurologist as planned.

## 2024-04-14 ENCOUNTER — Encounter: Payer: Self-pay | Admitting: Nurse Practitioner

## 2024-04-18 ENCOUNTER — Encounter: Payer: Self-pay | Admitting: Nurse Practitioner

## 2024-04-19 ENCOUNTER — Other Ambulatory Visit: Payer: Self-pay | Admitting: Family Medicine

## 2024-05-09 ENCOUNTER — Encounter: Payer: Self-pay | Admitting: Family Medicine

## 2024-05-10 ENCOUNTER — Other Ambulatory Visit: Payer: Self-pay

## 2024-05-11 ENCOUNTER — Other Ambulatory Visit: Payer: Self-pay | Admitting: Family Medicine

## 2024-05-11 NOTE — Telephone Encounter (Signed)
 Copied from CRM [id number]. Topic: Clinical - Medication Refill >> May 11, 2024  9:57 AM Ahlexyia S wrote: Medication: clonazePAM  (KLONOPIN ) 0.5 MG tablet  Has the patient contacted their pharmacy? No, pt stated she didn't want to contact pharmacy (Agent: If no, request that the patient contact the pharmacy for the refill. If patient does not wish to contact the pharmacy document the reason why and proceed with request.) (Agent: If yes, when and what did the pharmacy advise?)  This is the patient's preferred pharmacy:  [facility] [address], [place] - [address] [place] [address] Phone: [phone] Fax: [phone]  Is this the correct pharmacy for this prescription? Yes If no, delete pharmacy and type the correct one.   Has the prescription been filled recently? No  Is the patient out of the medication? No, very limited supply.  Has the patient been seen for an appointment in the last year OR does the patient have an upcoming appointment? Yes  Can we respond through MyChart? Yes  Agent: Please be advised that Rx refills may take up to 3 business days. We ask that you follow-up with your pharmacy.

## 2024-05-14 ENCOUNTER — Other Ambulatory Visit: Payer: Self-pay | Admitting: Family Medicine

## 2024-05-14 MED ORDER — CLONAZEPAM 0.5 MG PO TABS: 0.5000 mg | ORAL_TABLET | Freq: Every evening | ORAL | 3 refills | Status: DC | PRN

## 2024-05-17 DIAGNOSIS — G500 Trigeminal neuralgia: Secondary | ICD-10-CM | POA: Diagnosis not present

## 2024-05-17 DIAGNOSIS — H469 Unspecified optic neuritis: Secondary | ICD-10-CM | POA: Diagnosis not present

## 2024-05-17 DIAGNOSIS — H04129 Dry eye syndrome of unspecified lacrimal gland: Secondary | ICD-10-CM | POA: Diagnosis not present

## 2024-05-28 ENCOUNTER — Other Ambulatory Visit: Payer: Self-pay | Admitting: Family Medicine

## 2024-06-22 ENCOUNTER — Other Ambulatory Visit: Payer: Self-pay | Admitting: Family Medicine

## 2024-06-26 DIAGNOSIS — G501 Atypical facial pain: Secondary | ICD-10-CM | POA: Diagnosis not present

## 2024-06-26 DIAGNOSIS — G5131 Clonic hemifacial spasm, right: Secondary | ICD-10-CM | POA: Diagnosis not present

## 2024-08-01 DIAGNOSIS — Z1231 Encounter for screening mammogram for malignant neoplasm of breast: Secondary | ICD-10-CM | POA: Diagnosis not present

## 2024-08-01 DIAGNOSIS — Z01419 Encounter for gynecological examination (general) (routine) without abnormal findings: Secondary | ICD-10-CM | POA: Diagnosis not present

## 2024-09-02 ENCOUNTER — Other Ambulatory Visit: Payer: Self-pay | Admitting: Family Medicine

## 2024-09-10 ENCOUNTER — Other Ambulatory Visit: Payer: Self-pay | Admitting: Family Medicine

## 2024-09-10 MED ORDER — CLONAZEPAM 0.5 MG PO TABS: 0.5000 mg | ORAL_TABLET | Freq: Every evening | ORAL | 3 refills | Status: AC | PRN

## 2024-09-10 NOTE — Telephone Encounter (Signed)
 Copied from CRM [id number]. Topic: Clinical - Medication Refill >> Sep 10, 2024  8:47 AM Gattis SQUIBB wrote: Medication: Clonazepam  30 mg  She is out  Has the patient contacted their pharmacy? No  This is the patient's preferred pharmacy:  [facility] [address] [place] - [address] [place] [address] Phone: [phone] Fax: [phone]  Is this the correct pharmacy for this prescription? Yes If no, delete pharmacy and type the correct one.   Has the prescription been filled recently? Yes  Is the patient out of the medication? Yes  Has the patient been seen for an appointment in the last year OR does the patient have an upcoming appointment? Yes  Can we respond through MyChart? No  Agent: Please be advised that Rx refills may take up to 3 business days. We ask that you follow-up with your pharmacy.

## 2024-09-18 ENCOUNTER — Other Ambulatory Visit: Payer: Self-pay | Admitting: Family Medicine

## 2024-10-03 ENCOUNTER — Encounter: Payer: Self-pay | Admitting: Nurse Practitioner

## 2024-10-15 ENCOUNTER — Encounter: Admitting: Nurse Practitioner

## 2025-03-15 ENCOUNTER — Ambulatory Visit
